# Patient Record
Sex: Female | Born: 1990 | Race: Black or African American | Hispanic: No | Marital: Married | Smoking: Never smoker
Health system: Southern US, Community
[De-identification: ages and names within clinical notes are randomized; demographics above are authoritative.]

---

## 2020-12-11 ENCOUNTER — Emergency Department (HOSPITAL_COMMUNITY): Payer: Medicaid Other

## 2020-12-11 ENCOUNTER — Encounter (HOSPITAL_COMMUNITY)
Admission: EM | Disposition: A | Discharge status: Home / Self Care | Payer: Self-pay | Source: Home / Self Care | Attending: Emergency Medicine

## 2020-12-11 ENCOUNTER — Emergency Department (HOSPITAL_COMMUNITY)
Admission: EM | Admit: 2020-12-11 | Discharge: 2020-12-11 | Disposition: A | Discharge status: Home / Self Care | Payer: Medicaid Other | Attending: Emergency Medicine | Admitting: Emergency Medicine

## 2020-12-11 ENCOUNTER — Encounter (HOSPITAL_COMMUNITY): Payer: Self-pay | Admitting: Certified Registered Nurse Anesthetist

## 2020-12-11 ENCOUNTER — Other Ambulatory Visit: Payer: Self-pay

## 2020-12-11 ENCOUNTER — Encounter (HOSPITAL_COMMUNITY): Payer: Self-pay | Admitting: Emergency Medicine

## 2020-12-11 DIAGNOSIS — Z20822 Contact with and (suspected) exposure to covid-19: Secondary | ICD-10-CM | POA: Insufficient documentation

## 2020-12-11 DIAGNOSIS — Y92410 Unspecified street and highway as the place of occurrence of the external cause: Secondary | ICD-10-CM | POA: Insufficient documentation

## 2020-12-11 DIAGNOSIS — M25572 Pain in left ankle and joints of left foot: Secondary | ICD-10-CM | POA: Diagnosis not present

## 2020-12-11 DIAGNOSIS — S82402A Unspecified fracture of shaft of left fibula, initial encounter for closed fracture: Secondary | ICD-10-CM | POA: Insufficient documentation

## 2020-12-11 DIAGNOSIS — O9A211 Injury, poisoning and certain other consequences of external causes complicating pregnancy, first trimester: Secondary | ICD-10-CM | POA: Diagnosis not present

## 2020-12-11 DIAGNOSIS — N9489 Other specified conditions associated with female genital organs and menstrual cycle: Secondary | ICD-10-CM | POA: Insufficient documentation

## 2020-12-11 DIAGNOSIS — S82302A Unspecified fracture of lower end of left tibia, initial encounter for closed fracture: Secondary | ICD-10-CM | POA: Diagnosis not present

## 2020-12-11 DIAGNOSIS — S82202A Unspecified fracture of shaft of left tibia, initial encounter for closed fracture: Secondary | ICD-10-CM

## 2020-12-11 DIAGNOSIS — Z3490 Encounter for supervision of normal pregnancy, unspecified, unspecified trimester: Secondary | ICD-10-CM

## 2020-12-11 LAB — I-STAT BETA HCG BLOOD, ED (MC, WL, AP ONLY): I-stat hCG, quantitative: >2000 m[IU]/mL — ABNORMAL HIGH (ref <5)

## 2020-12-11 LAB — TYPE AND SCREEN
ABO/RH(D): O NEG
Antibody Screen: NEGATIVE

## 2020-12-11 LAB — RESP PANEL BY RT-PCR (FLU A&B, COVID) ARPGX2
Influenza A by PCR: NEGATIVE
Influenza B by PCR: NEGATIVE
SARS Coronavirus 2 by RT PCR: NEGATIVE

## 2020-12-11 LAB — HCG, QUANTITATIVE, PREGNANCY: hCG, Beta Chain, Quant, S: 59179 m[IU]/mL — ABNORMAL HIGH (ref <5)

## 2020-12-11 SURGERY — INSERTION, INTRAMEDULLARY ROD, TIBIA
Anesthesia: General | Laterality: Left

## 2020-12-11 NOTE — Discharge Instructions (Addendum)
Please keep outpatient appointment for repair of your leg. Keep leg elevated as much as possible.  Use acetaminophen over-the-counter for pain.  You can also use cold therapy but did not apply ice directly to leg.  Return if you are having worsening and or severe pain. Call Dr. Luvenia Starch office first thing tomorrow to be seen Monday prior to surgery.
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
## 2020-12-11 NOTE — Progress Notes (Signed)
Orthopedic Tech Progress Note Patient Details:  Pamela Santos 1991/01/08 [id number]  Ortho Devices Type of Ortho Device: Stirrup splint, Short leg splint Ortho Device/Splint Location: LLE Ortho Device/Splint Interventions: Ordered, Application   Post Interventions Patient Tolerated: Well Instructions Provided: Care of device, Poper ambulation with device  Pamela Santos A Pamela Santos 12/11/2020, 4:10 PM
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
## 2020-12-11 NOTE — Progress Notes (Signed)
RROB asked to doppler fht to prevent pt from having to go to MAU.  FHT dopplered at 151bpm.  E.Lawrence, NP notified and pt is OB cleared.

## 2020-12-12 ENCOUNTER — Telehealth: Payer: Self-pay | Admitting: Radiology

## 2020-12-12 NOTE — Telephone Encounter (Signed)
Left a message for patient via interpreter services for New OB appointment with Dr Donavan Foil on 01/06/21 @ 2:55 at [facility].

## 2020-12-15 ENCOUNTER — Encounter (HOSPITAL_COMMUNITY): Payer: Self-pay | Admitting: Student

## 2020-12-15 ENCOUNTER — Other Ambulatory Visit: Payer: Self-pay

## 2020-12-15 ENCOUNTER — Ambulatory Visit: Payer: Self-pay | Admitting: Student

## 2020-12-15 DIAGNOSIS — S82202A Unspecified fracture of shaft of left tibia, initial encounter for closed fracture: Secondary | ICD-10-CM

## 2020-12-15 DIAGNOSIS — S82402A Unspecified fracture of shaft of left fibula, initial encounter for closed fracture: Secondary | ICD-10-CM

## 2020-12-15 HISTORY — DX: Unspecified fracture of shaft of left tibia, initial encounter for closed fracture: S82.202A

## 2020-12-15 HISTORY — DX: Unspecified fracture of shaft of left tibia, initial encounter for closed fracture: S82.402A
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
## 2020-12-16 ENCOUNTER — Encounter (HOSPITAL_COMMUNITY): Payer: Self-pay | Admitting: Student

## 2020-12-16 ENCOUNTER — Ambulatory Visit (HOSPITAL_COMMUNITY): Payer: Medicaid Other | Admitting: Certified Registered Nurse Anesthetist

## 2020-12-16 ENCOUNTER — Ambulatory Visit (HOSPITAL_COMMUNITY): Payer: Medicaid Other

## 2020-12-16 ENCOUNTER — Encounter (HOSPITAL_COMMUNITY)
Admission: RE | Disposition: A | Discharge status: Home / Self Care | Payer: Self-pay | Source: Home / Self Care | Attending: Student

## 2020-12-16 ENCOUNTER — Ambulatory Visit (HOSPITAL_COMMUNITY)
Admission: RE | Admit: 2020-12-16 | Discharge: 2020-12-16 | Disposition: A | Discharge status: Home / Self Care | Payer: Medicaid Other | Attending: Student | Admitting: Student

## 2020-12-16 ENCOUNTER — Other Ambulatory Visit (HOSPITAL_COMMUNITY): Payer: Self-pay

## 2020-12-16 DIAGNOSIS — S82402A Unspecified fracture of shaft of left fibula, initial encounter for closed fracture: Secondary | ICD-10-CM

## 2020-12-16 DIAGNOSIS — S82202A Unspecified fracture of shaft of left tibia, initial encounter for closed fracture: Secondary | ICD-10-CM | POA: Diagnosis not present

## 2020-12-16 DIAGNOSIS — T148XXA Other injury of unspecified body region, initial encounter: Secondary | ICD-10-CM

## 2020-12-16 DIAGNOSIS — Z419 Encounter for procedure for purposes other than remedying health state, unspecified: Secondary | ICD-10-CM

## 2020-12-16 DIAGNOSIS — O9A212 Injury, poisoning and certain other consequences of external causes complicating pregnancy, second trimester: Secondary | ICD-10-CM | POA: Insufficient documentation

## 2020-12-16 DIAGNOSIS — Z3A14 14 weeks gestation of pregnancy: Secondary | ICD-10-CM | POA: Insufficient documentation

## 2020-12-16 DIAGNOSIS — O99212 Obesity complicating pregnancy, second trimester: Secondary | ICD-10-CM | POA: Diagnosis not present

## 2020-12-16 DIAGNOSIS — G8918 Other acute postprocedural pain: Secondary | ICD-10-CM | POA: Diagnosis not present

## 2020-12-16 HISTORY — PX: TIBIA IM NAIL INSERTION: SHX2516

## 2020-12-16 LAB — CBC
HCT: 37.9 % (ref 36.0–46.0)
Hemoglobin: 12.5 g/dL (ref 12.0–15.0)
MCH: 28.5 pg (ref 26.0–34.0)
MCHC: 33 g/dL (ref 30.0–36.0)
MCV: 86.5 fL (ref 80.0–100.0)
Platelets: 268 10*3/uL (ref 150–400)
RBC: 4.38 MIL/uL (ref 3.87–5.11)
RDW: 12.8 % (ref 11.5–15.5)
WBC: 5.9 10*3/uL (ref 4.0–10.5)
nRBC: 0 % (ref 0.0–0.2)

## 2020-12-16 SURGERY — INSERTION, INTRAMEDULLARY ROD, TIBIA
Anesthesia: General | Laterality: Left

## 2020-12-16 MED ORDER — ROCURONIUM BROMIDE 10 MG/ML (PF) SYRINGE
PREFILLED_SYRINGE | INTRAVENOUS | Status: AC
  Filled 2020-12-16: qty 10

## 2020-12-16 MED ORDER — DEXAMETHASONE SODIUM PHOSPHATE 10 MG/ML IJ SOLN
INTRAMUSCULAR | Status: DC | PRN
  Administered 2020-12-16: 5 mg via INTRAVENOUS

## 2020-12-16 MED ORDER — SUCCINYLCHOLINE CHLORIDE 200 MG/10ML IV SOSY
PREFILLED_SYRINGE | INTRAVENOUS | Status: AC
  Filled 2020-12-16: qty 10

## 2020-12-16 MED ORDER — ORAL CARE MOUTH RINSE: 15.0000 mL | Freq: Once | OROMUCOSAL | Status: AC

## 2020-12-16 MED ORDER — OXYCODONE HCL 5 MG PO TABS
ORAL_TABLET | ORAL | Status: AC
  Administered 2020-12-16: 5 mg via ORAL
  Filled 2020-12-16: qty 1

## 2020-12-16 MED ORDER — ONDANSETRON HCL 4 MG/2ML IJ SOLN
INTRAMUSCULAR | Status: DC | PRN
  Administered 2020-12-16: 4 mg via INTRAVENOUS

## 2020-12-16 MED ORDER — CHLORHEXIDINE GLUCONATE 0.12 % MT SOLN
15.0000 mL | Freq: Once | OROMUCOSAL | Status: AC
  Administered 2020-12-16: 15 mL via OROMUCOSAL
  Filled 2020-12-16: qty 15

## 2020-12-16 MED ORDER — ONDANSETRON HCL 4 MG/2ML IJ SOLN
INTRAMUSCULAR | Status: AC
  Filled 2020-12-16: qty 2

## 2020-12-16 MED ORDER — CEFAZOLIN SODIUM-DEXTROSE 2-4 GM/100ML-% IV SOLN
INTRAVENOUS | Status: AC
  Filled 2020-12-16: qty 100

## 2020-12-16 MED ORDER — MIDAZOLAM HCL 2 MG/2ML IJ SOLN
INTRAMUSCULAR | Status: DC | PRN
  Administered 2020-12-16 (×2): 1 mg via INTRAVENOUS

## 2020-12-16 MED ORDER — BUPIVACAINE-EPINEPHRINE (PF) 0.5% -1:200000 IJ SOLN
INTRAMUSCULAR | Status: DC | PRN
  Administered 2020-12-16: 10 mL via PERINEURAL
  Administered 2020-12-16: 20 mL via PERINEURAL

## 2020-12-16 MED ORDER — 0.9 % SODIUM CHLORIDE (POUR BTL) OPTIME
TOPICAL | Status: DC | PRN
  Administered 2020-12-16: 1000 mL

## 2020-12-16 MED ORDER — SUGAMMADEX SODIUM 200 MG/2ML IV SOLN
INTRAVENOUS | Status: DC | PRN
  Administered 2020-12-16: 200 mg via INTRAVENOUS

## 2020-12-16 MED ORDER — DEXAMETHASONE SODIUM PHOSPHATE 10 MG/ML IJ SOLN
INTRAMUSCULAR | Status: AC
  Filled 2020-12-16: qty 1

## 2020-12-16 MED ORDER — LIDOCAINE 2% (20 MG/ML) 5 ML SYRINGE
INTRAMUSCULAR | Status: DC | PRN
  Administered 2020-12-16: 60 mg via INTRAVENOUS

## 2020-12-16 MED ORDER — CEFAZOLIN SODIUM-DEXTROSE 2-3 GM-%(50ML) IV SOLR
INTRAVENOUS | Status: DC | PRN
  Administered 2020-12-16: 2 g via INTRAVENOUS

## 2020-12-16 MED ORDER — VANCOMYCIN HCL 1000 MG IV SOLR
INTRAVENOUS | Status: AC
  Filled 2020-12-16: qty 20

## 2020-12-16 MED ORDER — FENTANYL CITRATE (PF) 250 MCG/5ML IJ SOLN
INTRAMUSCULAR | Status: DC | PRN
  Administered 2020-12-16 (×3): 50 ug via INTRAVENOUS
  Administered 2020-12-16: 25 ug via INTRAVENOUS

## 2020-12-16 MED ORDER — OXYCODONE HCL 5 MG/5ML PO SOLN: 5.0000 mg | Freq: Once | ORAL | Status: AC | PRN

## 2020-12-16 MED ORDER — LACTATED RINGERS IV BOLUS
1000.0000 mL | Freq: Once | INTRAVENOUS | Status: AC
  Administered 2020-12-16: 1000 mL via INTRAVENOUS

## 2020-12-16 MED ORDER — PROPOFOL 500 MG/50ML IV EMUL
INTRAVENOUS | Status: DC | PRN
  Administered 2020-12-16: 150 ug/kg/min via INTRAVENOUS

## 2020-12-16 MED ORDER — FENTANYL CITRATE (PF) 100 MCG/2ML IJ SOLN: 25.0000 ug | INTRAMUSCULAR | Status: DC | PRN

## 2020-12-16 MED ORDER — LIDOCAINE 2% (20 MG/ML) 5 ML SYRINGE
INTRAMUSCULAR | Status: AC
  Filled 2020-12-16: qty 5

## 2020-12-16 MED ORDER — OXYCODONE HCL 5 MG PO TABS: 5.0000 mg | ORAL_TABLET | Freq: Once | ORAL | Status: AC | PRN

## 2020-12-16 MED ORDER — SUCCINYLCHOLINE CHLORIDE 200 MG/10ML IV SOSY
PREFILLED_SYRINGE | INTRAVENOUS | Status: DC | PRN
  Administered 2020-12-16: 80 mg via INTRAVENOUS

## 2020-12-16 MED ORDER — FENTANYL CITRATE (PF) 250 MCG/5ML IJ SOLN
INTRAMUSCULAR | Status: AC
  Filled 2020-12-16: qty 5

## 2020-12-16 MED ORDER — VANCOMYCIN HCL 1000 MG IV SOLR
INTRAVENOUS | Status: DC | PRN
  Administered 2020-12-16: 1000 mg

## 2020-12-16 MED ORDER — PROMETHAZINE HCL 25 MG/ML IJ SOLN
INTRAMUSCULAR | Status: AC
  Administered 2020-12-16: 6.25 mg via INTRAVENOUS
  Filled 2020-12-16: qty 1

## 2020-12-16 MED ORDER — ACETAMINOPHEN 500 MG PO TABS
1000.0000 mg | ORAL_TABLET | Freq: Once | ORAL | Status: AC
  Administered 2020-12-16: 1000 mg via ORAL
  Filled 2020-12-16: qty 2

## 2020-12-16 MED ORDER — PROPOFOL 10 MG/ML IV BOLUS
INTRAVENOUS | Status: DC | PRN
  Administered 2020-12-16: 120 mg via INTRAVENOUS
  Administered 2020-12-16: 30 mg via INTRAVENOUS

## 2020-12-16 MED ORDER — PROPOFOL 10 MG/ML IV BOLUS
INTRAVENOUS | Status: AC
  Filled 2020-12-16: qty 40

## 2020-12-16 MED ORDER — PROMETHAZINE HCL 25 MG/ML IJ SOLN: 6.2500 mg | INTRAMUSCULAR | Status: DC | PRN

## 2020-12-16 MED ORDER — ROCURONIUM BROMIDE 10 MG/ML (PF) SYRINGE
PREFILLED_SYRINGE | INTRAVENOUS | Status: DC | PRN
  Administered 2020-12-16: 10 mg via INTRAVENOUS
  Administered 2020-12-16: 20 mg via INTRAVENOUS
  Administered 2020-12-16: 30 mg via INTRAVENOUS

## 2020-12-16 MED ORDER — HYDROCODONE-ACETAMINOPHEN 5-325 MG PO TABS
1.0000 | ORAL_TABLET | Freq: Four times a day (QID) | ORAL | 0 refills | Status: DC | PRN
Start: 2020-12-16 — End: 2021-06-12
  Filled 2020-12-16: qty 30, 4d supply, fill #0

## 2020-12-16 MED ORDER — MIDAZOLAM HCL 2 MG/2ML IJ SOLN
INTRAMUSCULAR | Status: AC
  Filled 2020-12-16: qty 2

## 2020-12-16 MED ORDER — PHENYLEPHRINE HCL-NACL 20-0.9 MG/250ML-% IV SOLN
INTRAVENOUS | Status: DC | PRN
  Administered 2020-12-16: 40 ug/min via INTRAVENOUS

## 2020-12-16 MED ORDER — CLONIDINE HCL (ANALGESIA) 100 MCG/ML EP SOLN
EPIDURAL | Status: DC | PRN
  Administered 2020-12-16: 67 ug
  Administered 2020-12-16: 33 ug

## 2020-12-16 MED ORDER — SCOPOLAMINE 1 MG/3DAYS TD PT72
1.0000 | MEDICATED_PATCH | Freq: Once | TRANSDERMAL | Status: DC
  Administered 2020-12-16: 1.5 mg via TRANSDERMAL
  Filled 2020-12-16: qty 1

## 2020-12-16 MED ORDER — LACTATED RINGERS IV SOLN: INTRAVENOUS | Status: DC

## 2020-12-16 MED ORDER — ASPIRIN 81 MG PO TBEC
81.0000 mg | DELAYED_RELEASE_TABLET | Freq: Every day | ORAL | 0 refills | Status: AC
  Filled 2020-12-16: qty 30, 30d supply, fill #0

## 2020-12-16 SURGICAL SUPPLY — 54 items
BAG COUNTER SPONGE SURGICOUNT (BAG) ×2 IMPLANT
BIT DRILL CALIBRATED 4.2 (BIT) ×1 IMPLANT
BIT DRILL FLUTED 3.2X145 SHORT (BIT) ×2 IMPLANT
BLADE SURG 10 STRL SS (BLADE) ×4 IMPLANT
BNDG COHESIVE 4X5 TAN STRL (GAUZE/BANDAGES/DRESSINGS) ×2 IMPLANT
BNDG ELASTIC 4X5.8 VLCR STR LF (GAUZE/BANDAGES/DRESSINGS) ×2 IMPLANT
BNDG ELASTIC 6X5.8 VLCR STR LF (GAUZE/BANDAGES/DRESSINGS) ×4 IMPLANT
BNDG GAUZE ELAST 4 BULKY (GAUZE/BANDAGES/DRESSINGS) IMPLANT
BRUSH SCRUB EZ PLAIN DRY (MISCELLANEOUS) ×4 IMPLANT
CHLORAPREP W/TINT 26 (MISCELLANEOUS) ×4 IMPLANT
COVER SURGICAL LIGHT HANDLE (MISCELLANEOUS) ×4 IMPLANT
DRAPE C-ARM 42X72 X-RAY (DRAPES) ×2 IMPLANT
DRAPE C-ARMOR (DRAPES) ×2 IMPLANT
DRAPE HALF SHEET 40X57 (DRAPES) ×4 IMPLANT
DRAPE IMP U-DRAPE 54X76 (DRAPES) ×4 IMPLANT
DRAPE INCISE IOBAN 66X45 STRL (DRAPES) IMPLANT
DRAPE ORTHO SPLIT 77X108 STRL (DRAPES) ×2
DRAPE SURG ORHT 6 SPLT 77X108 (DRAPES) ×2 IMPLANT
DRAPE U-SHAPE 47X51 STRL (DRAPES) ×2 IMPLANT
DRILL BIT CALIBRATED 4.2 (BIT) ×2
DRSG ADAPTIC 3X8 NADH LF (GAUZE/BANDAGES/DRESSINGS) IMPLANT
ELECT REM PT RETURN 9FT ADLT (ELECTROSURGICAL) ×2
ELECTRODE REM PT RTRN 9FT ADLT (ELECTROSURGICAL) ×1 IMPLANT
GAUZE SPONGE 4X4 12PLY STRL (GAUZE/BANDAGES/DRESSINGS) ×4 IMPLANT
GLOVE SURG ENC MOIS LTX SZ6.5 (GLOVE) ×6 IMPLANT
GLOVE SURG ENC MOIS LTX SZ7.5 (GLOVE) ×8 IMPLANT
GLOVE SURG UNDER POLY LF SZ6.5 (GLOVE) ×2 IMPLANT
GLOVE SURG UNDER POLY LF SZ7.5 (GLOVE) ×2 IMPLANT
GOWN STRL REUS W/ TWL LRG LVL3 (GOWN DISPOSABLE) ×2 IMPLANT
GOWN STRL REUS W/TWL LRG LVL3 (GOWN DISPOSABLE) ×2
GUIDEWIRE 3.2X400 (WIRE) ×2 IMPLANT
KIT BASIN OR (CUSTOM PROCEDURE TRAY) ×2 IMPLANT
KIT TURNOVER KIT B (KITS) ×2 IMPLANT
NAIL TIB TFNA 8X330 (Nail) ×2 IMPLANT
PACK TOTAL JOINT (CUSTOM PROCEDURE TRAY) ×2 IMPLANT
PAD ARMBOARD 7.5X6 YLW CONV (MISCELLANEOUS) ×4 IMPLANT
PAD CAST 4YDX4 CTTN HI CHSV (CAST SUPPLIES) ×1 IMPLANT
PADDING CAST COTTON 4X4 STRL (CAST SUPPLIES) ×1
PADDING CAST COTTON 6X4 STRL (CAST SUPPLIES) ×2 IMPLANT
REAMER ROD 3.8 BALL TIP 3X950 (ORTHOPEDIC DISPOSABLE SUPPLIES) ×2 IMPLANT
SCREW LOCK HDLS 5X34 (Screw) ×2 IMPLANT
SCREW LOCK HDLS 5X60 (Screw) ×2 IMPLANT
SCREW LOCK LP HDLS 4X32 (Screw) ×4 IMPLANT
STAPLER VISISTAT 35W (STAPLE) ×2 IMPLANT
STRIP CLOSURE SKIN 1/2X4 (GAUZE/BANDAGES/DRESSINGS) ×2 IMPLANT
SUT MNCRL AB 3-0 PS2 18 (SUTURE) IMPLANT
SUT MNCRL AB 3-0 PS2 27 (SUTURE) ×2 IMPLANT
SUT VIC AB 0 CT1 27 (SUTURE) ×2
SUT VIC AB 0 CT1 27XBRD ANBCTR (SUTURE) ×2 IMPLANT
SUT VIC AB 2-0 CT1 27 (SUTURE) ×2
SUT VIC AB 2-0 CT1 TAPERPNT 27 (SUTURE) ×2 IMPLANT
TOWEL GREEN STERILE (TOWEL DISPOSABLE) ×4 IMPLANT
TOWEL GREEN STERILE FF (TOWEL DISPOSABLE) ×2 IMPLANT
YANKAUER SUCT BULB TIP NO VENT (SUCTIONS) IMPLANT

## 2020-12-16 NOTE — Op Note (Signed)
Orthopaedic Surgery Operative Note (CSN: [id number] ) Date of Surgery: 12/16/2020  Admit Date: 12/16/2020   Diagnoses: Pre-Op Diagnoses: Left tibia nonunion/impending malunion  Post-Op Diagnosis: Same  Procedures: CPT 27759-Intramedullary nailing of left tibia fracture  Surgeons : Primary: Roby Lofts, MD  Assistant: Ulyses Southward, PA-C  Location: OR 7   Anesthesia:General with regional anesthesia   Antibiotics: Ancef 2g preop with 1 gm vancomycin placed topically   Tourniquet time: None used    Estimated Blood Loss:75 mL  Complications:None   Specimens:None  Implants: Implant Name Type Inv. Item Serial No. Manufacturer Lot No. LRB No. Used Action  NAIL TIB TFNA [id number] - [id number] Nail NAIL TIB TFNA [id number]  [employer] [id number] Left 1 Implanted  LOW PROF LCKNG SCREW F/IM NAIL4.0MM / L / XL25/ STER     [id number] Left 2 Implanted  LOW PROF LCKNG SCREW F/IM NAIL 5.0MM / L / XL25/ STER     [id number] Left 1 Implanted  SCREW LOCK HDLS 5X34 - [id number] Screw SCREW LOCK HDLS 5X34  [employer] [id number] Left 1 Implanted     Indications for Surgery: 30 year old female who injured her leg in [place] she was initially casted but alignment was not maintained and subsequently she travel to [place].  This is approximately 5 weeks ago.  Due to the continued unstable nature of her injury, the continued pain, the deformity and the loss of function that a malunion would because I recommend proceeding with intramedullary nailing of left tibia.  Risks and benefits were discussed with the patient.  Risks included but not limited to bleeding, infection, malunion, nonunion, hardware failure, hardware irritation, nerve or blood vessel injury, DVT, even possibility anesthetic complications.  She agreed to proceed with surgery and consent was obtained.  Operative Findings: Takedown of previous partial united tibia fracture with intramedullary nailing using Synthes TNA 8 x 330  mm nail  Procedure: The patient was identified in the preoperative holding area. Consent was confirmed with the patient and their family and all questions were answered. The operative extremity was marked after confirmation with the patient. she was then brought back to the operating room by our anesthesia colleagues.  She was placed under general anesthetic and carefully transferred over to a radiolucent flat top table.  Her abdomen was covered with lead to prevent radiation exposure to her unborn fetus.  The left lower extremity was then prepped and draped in usual sterile fashion.  A timeout was performed to verify the patient, the procedure, and the extremity.  Preoperative antibiotics were dosed.  Fluoroscopic imaging was obtained that showed the unstable and shortened nature of her fracture.  An attempt was made to reduce the fracture closed but this was not allowing any reduction.  As result I made a direct anterior approach to the tibia carried down through skin and subcutaneous tissue.  I then incised through the callus that was forming and performed a subperiosteal dissection to expose the fracture.  I cleaned out the hematoma and the cleared the soft tissue and provided traction to the lower extremity.  I then clamped the fracture in place to reduce the shaft of the tibia.  I did not expose or disrupt any healing around the fibula.  Once I had adequate reduction I then made a lateral parapatellar incision at the knee.  I carried it down through skin and subcutaneous tissue.  I incised through the retinaculum to mobilize the patella medially.  I then directed a threaded guidewire at  the proximal tibia and advanced into the metaphysis.  I then used an entry reamer to enter the medullary canal.  I then passed the ball-tipped guidewire down the center of the canal and seated into the distal metaphysis.  I then measured the length and chose to use a 330 mm nail.  I then sequentially reamed from 30mm to  9.5 mm I obtained good chatter and decided to place an 8 mm nail.  The nail was then passed.  I then used a perfect circle technique to place 2 distal medial to lateral interlocking screws.  I then used the targeting arm to place 2 proximal interlocking screws.  Final fluoroscopic imaging was obtained.  The incisions were copiously irrigated.  A gram of vancomycin powder was placed into the incision.  A layer closure of 0 Vicryl, 2-0 Vicryl and 3-0 Monocryl was used to close the skin.  Sterile dressings were placed.  Patient was then awoke from anesthesia and taken to the PACU in stable condition.  Post Op Plan/Instructions: Patient will be nonweightbearing to the left lower extremity.  She will receive aspirin for DVT prophylaxis.  She will be discharged home postop.  She will follow-up in 2 weeks for x-rays and wound check.  I was present and performed the entire surgery.  Ulyses Southward, PA-C did assist me throughout the case. An assistant was necessary given the difficulty in approach, maintenance of reduction and ability to instrument the fracture.   Truitt Merle, MD [facility]

## 2020-12-16 NOTE — Transfer of Care (Signed)
Immediate Anesthesia Transfer of Care Note  Patient: Silvano Rusk  Procedure(s) Performed: INTRAMEDULLARY (IM) NAIL TIBIAL (Left)  Patient Location: PACU  Anesthesia Type:General  Level of Consciousness: awake and alert   Airway & Oxygen Therapy: Patient Spontanous Breathing and Patient connected to face mask oxygen  Post-op Assessment: Report given to RN and Post -op Vital signs reviewed and stable  Post vital signs: Reviewed and stable  Last Vitals:  Vitals Value Taken Time  BP 86/61 12/16/20 1051  Temp    Pulse 100 12/16/20 1056  Resp 17 12/16/20 1056  SpO2 98 % 12/16/20 1056  Vitals shown include unvalidated device data.  Last Pain:  Vitals:   12/16/20 0713  TempSrc:   PainSc: 5          Complications: No notable events documented.

## 2020-12-16 NOTE — Anesthesia Procedure Notes (Signed)
Anesthesia Regional Block: Adductor canal block   Pre-Anesthetic Checklist: , timeout performed,  Correct Patient, Correct Site, Correct Laterality,  Correct Procedure, Correct Position, site marked,  Risks and benefits discussed,  Pre-op evaluation,  At surgeon's request and post-op pain management  Laterality: Left  Prep: Maximum Sterile Barrier Precautions used, chloraprep       Needles:  Injection technique: Single-shot  Needle Type: Echogenic Stimulator Needle     Needle Length: 9cm  Needle Gauge: 22     Additional Needles:   Procedures:,,,, ultrasound used (permanent image in chart),,    Narrative:  Start time: 12/16/2020 8:18 AM End time: 12/16/2020 8:21 AM Injection made incrementally with aspirations every 5 mL.  Performed by: Personally  Anesthesiologist: Kaylyn Layer, MD  Additional Notes: Risks, benefits, and alternative discussed. Patient gave consent for procedure. Patient prepped and draped in sterile fashion. Sedation administered, patient remains easily responsive to voice. Relevant anatomy identified with ultrasound guidance. Local anesthetic given in 5cc increments with no signs or symptoms of intravascular injection. No pain or paraesthesias with injection. Patient monitored throughout procedure with signs of LAST or immediate complications. Tolerated well. Ultrasound image placed in chart.  Amalia Greenhouse, MD

## 2020-12-16 NOTE — Progress Notes (Signed)
Orthopedic Tech Progress Note Patient Details:  Pamela Santos 1990/02/16 [id number]  Ortho Devices Type of Ortho Device: CAM walker Ortho Device/Splint Location: Left leg Ortho Device/Splint Interventions: Application   Post Interventions Patient Tolerated: Well  Pamela Santos 12/16/2020, 12:03 PM

## 2020-12-16 NOTE — Anesthesia Procedure Notes (Signed)
Procedure Name: Intubation Date/Time: 12/16/2020 8:50 AM Performed by: Reece Agar, CRNA Pre-anesthesia Checklist: Patient identified, Emergency Drugs available, Suction available and Patient being monitored Patient Re-evaluated:Patient Re-evaluated prior to induction Oxygen Delivery Method: Circle System Utilized Preoxygenation: Pre-oxygenation with 100% oxygen Induction Type: IV induction, Rapid sequence and Cricoid Pressure applied Ventilation: Unable to mask ventilate Laryngoscope Size: Mac and 3 Grade View: Grade I Tube type: Oral Tube size: 7.0 mm Number of attempts: 1 Airway Equipment and Method: Stylet Placement Confirmation: ETT inserted through vocal cords under direct vision, positive ETCO2 and breath sounds checked- equal and bilateral Secured at: 21 cm Tube secured with: Tape Dental Injury: Teeth and Oropharynx as per pre-operative assessment  Comments: Pt having n/v with pregnancy, RSI performed.

## 2020-12-16 NOTE — Anesthesia Preprocedure Evaluation (Addendum)
Anesthesia Evaluation  Patient identified by MRN, date of birth, ID band Patient awake    Reviewed: Allergy & Precautions, NPO status , Patient's Chart, lab work & pertinent test results  History of Anesthesia Complications Negative for: history of anesthetic complications  Airway Mallampati: II  TM Distance: >3 FB Neck ROM: Full    Dental no notable dental hx.    Pulmonary neg pulmonary ROS,    Pulmonary exam normal        Cardiovascular negative cardio ROS Normal cardiovascular exam     Neuro/Psych negative neurological ROS  negative psych ROS   GI/Hepatic negative GI ROS, Neg liver ROS,   Endo/Other  negative endocrine ROS  Renal/GU negative Renal ROS  negative genitourinary   Musculoskeletal Left tib/fib fracture   Abdominal   Peds  Hematology negative hematology ROS (+)   Anesthesia Other Findings Day of surgery medications reviewed with patient.  Reproductive/Obstetrics (+) Pregnancy ([redacted]w[redacted]d gestation)                            Anesthesia Physical Anesthesia Plan  ASA: 2  Anesthesia Plan: General   Post-op Pain Management: GA combined w/ Regional for post-op pain   Induction: Intravenous  PONV Risk Score and Plan: 3 and Treatment may vary due to age or medical condition, Ondansetron, Dexamethasone, Scopolamine patch - Pre-op, TIVA and Propofol infusion  Airway Management Planned: Oral ETT  Additional Equipment: None  Intra-op Plan:   Post-operative Plan: Extubation in OR  Informed Consent: I have reviewed the patients History and Physical, chart, labs and discussed the procedure including the risks, benefits and alternatives for the proposed anesthesia with the patient or authorized representative who has indicated his/her understanding and acceptance.     Dental advisory given and Interpreter used for interveiw  Plan Discussed with: CRNA  Anesthesia Plan  Comments: (Consent reviewed with assistance of [place] interpreter in preop. Stephannie Peters, MD)      Anesthesia Quick Evaluation

## 2020-12-16 NOTE — Anesthesia Procedure Notes (Signed)
Anesthesia Regional Block: Popliteal block   Pre-Anesthetic Checklist: , timeout performed,  Correct Patient, Correct Site, Correct Laterality,  Correct Procedure, Correct Position, site marked,  Risks and benefits discussed,  Pre-op evaluation,  At surgeon's request and post-op pain management  Laterality: Left  Prep: Maximum Sterile Barrier Precautions used, chloraprep       Needles:  Injection technique: Single-shot  Needle Type: Echogenic Stimulator Needle     Needle Length: 9cm  Needle Gauge: 22     Additional Needles:   Procedures:,,,, ultrasound used (permanent image in chart),,    Narrative:  Start time: 12/16/2020 8:21 AM End time: 12/16/2020 8:23 AM Injection made incrementally with aspirations every 5 mL.  Performed by: Personally  Anesthesiologist: Kaylyn Layer, MD  Additional Notes: Risks, benefits, and alternative discussed. Patient gave consent for procedure. Patient prepped and draped in sterile fashion. Sedation administered, patient remains easily responsive to voice. Relevant anatomy identified with ultrasound guidance. Local anesthetic given in 5cc increments with no signs or symptoms of intravascular injection. No pain or paraesthesias with injection. Patient monitored throughout procedure with signs of LAST or immediate complications. Tolerated well. Ultrasound image placed in chart.  Amalia Greenhouse, MD
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
## 2020-12-16 NOTE — Discharge Instructions (Addendum)
[facility] Discharge Instructions   General Discharge Instructions  WEIGHT BEARING STATUS: Non-weightbearing left leg  RANGE OF MOTION/ACTIVITY: Ok to come out of your black boot ankle range of motion as tolerated. No restrictions on knee motion  Wound Care:You may remove your surgical dressing on post-op day #2 (Friday 12/18/20). Leave the white steri-strips in place over your incisions. Incisions can be left open to air if there is no drainage. If incision continues to have drainage, follow wound care instructions below. Okay to shower if no drainage from incisions.  DVT/PE prophylaxis: Aspirin 81 mg daily x 30 days  Diet: as you were eating previously.  Can use over the counter stool softeners and bowel preparations, such as Miralax, to help with bowel movements.  Narcotics can be constipating.  Be sure to drink plenty of fluids  PAIN MEDICATION USE AND EXPECTATIONS  You have likely been given narcotic medications to help control your pain.  After a traumatic event that results in an fracture (broken bone) with or without surgery, it is ok to use narcotic pain medications to help control one's pain.  We understand that everyone responds to pain differently and each individual patient will be evaluated on a regular basis for the continued need for narcotic medications. Ideally, narcotic medication use should last no more than 6-8 weeks (coinciding with fracture healing).   As a patient it is your responsibility as well to monitor narcotic medication use and report the amount and frequency you use these medications when you come to your office visit.   We would also advise that if you are using narcotic medications, you should take a dose prior to therapy to maximize you participation.  IF YOU ARE ON NARCOTIC MEDICATIONS IT IS NOT PERMISSIBLE TO OPERATE A MOTOR VEHICLE (MOTORCYCLE/CAR/TRUCK/MOPED) OR HEAVY MACHINERY DO NOT MIX NARCOTICS WITH OTHER CNS (CENTRAL NERVOUS  SYSTEM) DEPRESSANTS SUCH AS ALCOHOL   STOP SMOKING OR USING NICOTINE PRODUCTS!!!!  As discussed nicotine severely impairs your body's ability to heal surgical and traumatic wounds but also impairs bone healing.  Wounds and bone heal by forming microscopic blood vessels (angiogenesis) and nicotine is a vasoconstrictor (essentially, shrinks blood vessels).  Therefore, if vasoconstriction occurs to these microscopic blood vessels they essentially disappear and are unable to deliver necessary nutrients to the healing tissue.  This is one modifiable factor that you can do to dramatically increase your chances of healing your injury.    (This means no smoking, no nicotine gum, patches, etc)  DO NOT USE NONSTEROIDAL ANTI-INFLAMMATORY DRUGS (NSAID'S)  Using products such as Advil (ibuprofen), Aleve (naproxen), Motrin (ibuprofen) for additional pain control during fracture healing can delay and/or prevent the healing response.  If you would like to take over the counter (OTC) medication, Tylenol (acetaminophen) is ok.  However, some narcotic medications that are given for pain control contain acetaminophen as well. Therefore, you should not exceed more than 4000 mg of tylenol in a day if you do not have liver disease.  Also note that there are may OTC medicines, such as cold medicines and allergy medicines that my contain tylenol as well.  If you have any questions about medications and/or interactions please ask your doctor/PA or your pharmacist.      ICE AND ELEVATE INJURED/OPERATIVE EXTREMITY  Using ice and elevating the injured extremity above your heart can help with swelling and pain control.  Icing in a pulsatile fashion, such as 20 minutes on and 20 minutes off, can be followed.  Do not place ice directly on skin. Make sure there is a barrier between to skin and the ice pack.    Using frozen items such as frozen peas works well as the conform nicely to the are that needs to be iced.  USE AN ACE WRAP  OR TED HOSE FOR SWELLING CONTROL  In addition to icing and elevation, Ace wraps or TED hose are used to help limit and resolve swelling.  It is recommended to use Ace wraps or TED hose until you are informed to stop.    When using Ace Wraps start the wrapping distally (farthest away from the body) and wrap proximally (closer to the body)   Example: If you had surgery on your leg or thing and you do not have a splint on, start the ace wrap at the toes and work your way up to the thigh        If you had surgery on your upper extremity and do not have a splint on, start the ace wrap at your fingers and work your way up to the upper arm   CALL THE OFFICE WITH ANY QUESTIONS OR CONCERNS: [phone]   VISIT OUR WEBSITE FOR ADDITIONAL INFORMATION: [email or web address]     Discharge Wound Care Instructions  Do NOT apply any ointments, solutions or lotions to pin sites or surgical wounds.  These prevent needed drainage and even though solutions like hydrogen peroxide kill bacteria, they also damage cells lining the pin sites that help fight infection.  Applying lotions or ointments can keep the wounds moist and can cause them to breakdown and open up as well. This can increase the risk for infection. When in doubt call the office.   If any drainage is noted, use gauze, Kerlix, and an ace wrap.  Once the incision is completely dry and without drainage, it may be left open to air out.  Showering may begin 36-48 hours later.  Cleaning gently with soap and water.

## 2020-12-17 NOTE — Anesthesia Postprocedure Evaluation (Signed)
Anesthesia Post Note  Patient: Pamela Santos  Procedure(s) Performed: INTRAMEDULLARY (IM) NAIL TIBIAL (Left)     Patient location during evaluation: PACU Anesthesia Type: General Level of consciousness: awake and alert Pain management: pain level controlled Vital Signs Assessment: post-procedure vital signs reviewed and stable Respiratory status: spontaneous breathing, nonlabored ventilation, respiratory function stable and patient connected to nasal cannula oxygen Cardiovascular status: blood pressure returned to baseline and stable Postop Assessment: no apparent nausea or vomiting Anesthetic complications: no   No notable events documented.  Last Vitals:  Vitals:   12/16/20 1115 12/16/20 1130  BP: (!) 87/56 (!) 92/58  Pulse: 94 84  Resp: 18 13  Temp:  36.7 C  SpO2: 100% 100%    Last Pain:  Vitals:   12/16/20 1115  TempSrc:   PainSc: 6                  Karlita Lichtman

## 2020-12-18 ENCOUNTER — Encounter (HOSPITAL_COMMUNITY): Payer: Self-pay | Admitting: Student

## 2021-01-06 ENCOUNTER — Encounter: Payer: Self-pay | Admitting: Obstetrics and Gynecology

## 2021-01-31 NOTE — L&D Delivery Note (Addendum)
Delivery Note ?Pt progressed w/o augmentation, had SROM (clear fluid).  After about an hour of pushing, at 3:41 PM a viable female was delivered via Vaginal, Spontaneous (Presentation: Left Occiput Anterior).  APGAR: 9, 9; weight pending. After 1 minute, the cord was clamped and cut. 20 units of pitocin diluted in 500cc LR was infused rapidly IV.  The placenta separated spontaneously and delivered via CCT and maternal pushing effort.  It was inspected and appears to be intact with a 3 VC.  ? ? ?Anesthesia: None ?Episiotomy:  none ?Lacerations:  1st degree, hemostatic ?Suture Repair:  ?Est. Blood Loss (mL):  200 ? ?Mom to postpartum.  Baby to Couplet care / Skin to Skin. ? ?Pamela Santos ?06/10/2021, 4:04 PM ? ? ? ?

## 2021-02-05 ENCOUNTER — Ambulatory Visit (INDEPENDENT_AMBULATORY_CARE_PROVIDER_SITE_OTHER): Payer: Self-pay | Admitting: Family

## 2021-02-05 ENCOUNTER — Encounter: Payer: Self-pay | Admitting: Family

## 2021-02-05 ENCOUNTER — Other Ambulatory Visit (HOSPITAL_COMMUNITY)
Admission: RE | Admit: 2021-02-05 | Discharge: 2021-02-05 | Disposition: A | Discharge status: Home / Self Care | Payer: Medicaid Other | Source: Ambulatory Visit | Attending: Obstetrics and Gynecology | Admitting: Obstetrics and Gynecology

## 2021-02-05 ENCOUNTER — Ambulatory Visit: Payer: No Typology Code available for payment source

## 2021-02-05 ENCOUNTER — Other Ambulatory Visit: Payer: Self-pay

## 2021-02-05 VITALS — BP 90/58 | HR 92 | Wt 125.7 lb

## 2021-02-05 DIAGNOSIS — O0990 Supervision of high risk pregnancy, unspecified, unspecified trimester: Secondary | ICD-10-CM | POA: Insufficient documentation

## 2021-02-05 NOTE — Progress Notes (Signed)
Patient ultrasound scheduled for 02/05/21 at 10 AM. Patient notified.

## 2021-02-05 NOTE — Progress Notes (Signed)
° °  PRENATAL VISIT NOTE  Subjective:  Pamela Santos is a 31 y.o. G1P0 at [redacted]w[redacted]d being seen today for new OB visit.  Recently moved her from [place] 2 months ago, lives with her brother. FOB still in [place].   She is currently monitored for the following issues:  None at this time for this low-risk pregnancy and has Tibia/fibula fracture, shaft, left, closed, initial encounter and Supervision of high risk pregnancy, antepartum on their problem list.  Patient reports no complaints, other than white discharge, no itching or odor.  Discharge described as thick in consistency, not watery. .  Contractions: Not present. Vag. Bleeding: None.  Movement: Present. Denies leaking of fluid.   The following portions of the patient's history were reviewed and updated as appropriate: allergies, current medications, past family history, past medical history, past social history, past surgical history and problem list.   Objective:   Vitals:   02/05/21 0900  BP: (!) 90/58  Pulse: 92  Weight: 125 lb 11.2 oz (57 kg)    Fetal Status: Fetal Heart Rate (bpm): 156   Movement: Present     General:  Alert, oriented and cooperative. Patient is in no acute distress.  Skin: Skin is warm and dry. No rash noted.   Cardiovascular: Normal heart rate noted  Respiratory: Normal respiratory effort, no problems with respiration noted  Abdomen: Soft, gravid, appropriate for gestational age.  Pain/Pressure: Absent     Pelvic: Cervical exam deferred until pap smear      Visualized vaginal area; discharge not watery, normal in appearance  Extremities: Normal range of motion.  Edema: None  Mental Status: Normal mood and affect. Normal behavior. Normal judgment and thought content.   Assessment and Plan:  Pregnancy: G1P0 at [redacted]w[redacted]d 1. Supervision of high risk pregnancy, antepartum - New OB labs - [place] MFM OB DETAIL +14 WK; Future - Navigation scheduled for today - Reviewed prenatal schedule (every 4 weeks, every 2 weeks, every  week)3  Preterm labor symptoms and general obstetric precautions including but not limited to vaginal bleeding, contractions, leaking of fluid and fetal movement were reviewed in detail with the patient. Please refer to After Visit Summary for other counseling recommendations.   Return in about 4 weeks (around 03/05/2021).  No future appointments.  Eino Farber [name], CNM

## 2021-02-05 NOTE — Progress Notes (Signed)
Patient reports white vaginal discharge. Denies any odor or itching. I offered her to do a "self swab" she accepted. Instructions were given and patient verbalized understanding.    Pamela Santos, CMA

## 2021-02-06 LAB — CERVICOVAGINAL ANCILLARY ONLY
Bacterial Vaginitis (gardnerella): POSITIVE — AB
Candida Glabrata: NEGATIVE
Candida Vaginitis: NEGATIVE
Chlamydia: NEGATIVE
Comment: NEGATIVE
Comment: NEGATIVE
Comment: NEGATIVE
Comment: NEGATIVE
Comment: NEGATIVE
Comment: NORMAL
Neisseria Gonorrhea: NEGATIVE
Trichomonas: NEGATIVE

## 2021-02-06 LAB — CBC/D/PLT+RPR+RH+ABO+RUBIGG...
Antibody Screen: NEGATIVE
Basophils Absolute: 0 10*3/uL (ref 0.0–0.2)
Basos: 1 %
EOS (ABSOLUTE): 0.2 10*3/uL (ref 0.0–0.4)
Eos: 2 %
HCV Ab: <0.1 s/co ratio (ref 0.0–0.9)
HIV Screen 4th Generation wRfx: NONREACTIVE
Hematocrit: 34.6 % (ref 34.0–46.6)
Hemoglobin: 11.6 g/dL (ref 11.1–15.9)
Hepatitis B Surface Ag: NEGATIVE
Immature Grans (Abs): 0.1 10*3/uL (ref 0.0–0.1)
Immature Granulocytes: 1 %
Lymphocytes Absolute: 2.7 10*3/uL (ref 0.7–3.1)
Lymphs: 38 %
MCH: 28.1 pg (ref 26.6–33.0)
MCHC: 33.5 g/dL (ref 31.5–35.7)
MCV: 84 fL (ref 79–97)
Monocytes Absolute: 0.6 10*3/uL (ref 0.1–0.9)
Monocytes: 8 %
Neutrophils Absolute: 3.6 10*3/uL (ref 1.4–7.0)
Neutrophils: 50 %
Platelets: 313 10*3/uL (ref 150–450)
RBC: 4.13 x10E6/uL (ref 3.77–5.28)
RDW: 13.2 % (ref 11.7–15.4)
RPR Ser Ql: NONREACTIVE
Rh Factor: NEGATIVE
Rubella Antibodies, IGG: 9.57 index (ref >0.99)
WBC: 7.1 10*3/uL (ref 3.4–10.8)

## 2021-02-06 LAB — HCV INTERPRETATION

## 2021-02-06 LAB — HEMOGLOBIN A1C
Est. average glucose Bld gHb Est-mCnc: 97 mg/dL
Hgb A1c MFr Bld: 5 % (ref 4.8–5.6)

## 2021-02-07 LAB — URINE CULTURE, OB REFLEX

## 2021-02-07 LAB — CULTURE, OB URINE

## 2021-02-22 ENCOUNTER — Ambulatory Visit: Payer: Medicaid Other | Attending: Family

## 2021-02-22 ENCOUNTER — Encounter: Payer: Self-pay | Admitting: *Deleted

## 2021-02-22 ENCOUNTER — Other Ambulatory Visit: Payer: Self-pay | Admitting: *Deleted

## 2021-02-22 ENCOUNTER — Other Ambulatory Visit: Payer: Self-pay

## 2021-02-22 ENCOUNTER — Ambulatory Visit: Payer: Medicaid Other | Admitting: *Deleted

## 2021-02-22 VITALS — BP 96/62 | HR 107

## 2021-02-22 DIAGNOSIS — O0990 Supervision of high risk pregnancy, unspecified, unspecified trimester: Secondary | ICD-10-CM | POA: Diagnosis present

## 2021-02-22 DIAGNOSIS — Z3689 Encounter for other specified antenatal screening: Secondary | ICD-10-CM

## 2021-02-22 DIAGNOSIS — Z6791 Unspecified blood type, Rh negative: Secondary | ICD-10-CM

## 2021-02-22 DIAGNOSIS — O26899 Other specified pregnancy related conditions, unspecified trimester: Secondary | ICD-10-CM

## 2021-02-22 DIAGNOSIS — Z362 Encounter for other antenatal screening follow-up: Secondary | ICD-10-CM

## 2021-03-05 ENCOUNTER — Encounter: Payer: Self-pay | Admitting: Obstetrics and Gynecology

## 2021-03-05 ENCOUNTER — Other Ambulatory Visit (HOSPITAL_COMMUNITY): Payer: Self-pay

## 2021-03-05 ENCOUNTER — Other Ambulatory Visit: Payer: Self-pay

## 2021-03-05 ENCOUNTER — Ambulatory Visit (INDEPENDENT_AMBULATORY_CARE_PROVIDER_SITE_OTHER): Payer: Medicaid Other | Admitting: Obstetrics and Gynecology

## 2021-03-05 VITALS — BP 103/70 | HR 91 | Wt 130.9 lb

## 2021-03-05 DIAGNOSIS — Z789 Other specified health status: Secondary | ICD-10-CM

## 2021-03-05 DIAGNOSIS — O26899 Other specified pregnancy related conditions, unspecified trimester: Secondary | ICD-10-CM

## 2021-03-05 DIAGNOSIS — B9689 Other specified bacterial agents as the cause of diseases classified elsewhere: Secondary | ICD-10-CM

## 2021-03-05 DIAGNOSIS — N760 Acute vaginitis: Secondary | ICD-10-CM

## 2021-03-05 DIAGNOSIS — R630 Anorexia: Secondary | ICD-10-CM

## 2021-03-05 DIAGNOSIS — O0990 Supervision of high risk pregnancy, unspecified, unspecified trimester: Secondary | ICD-10-CM

## 2021-03-05 DIAGNOSIS — O0930 Supervision of pregnancy with insufficient antenatal care, unspecified trimester: Secondary | ICD-10-CM | POA: Insufficient documentation

## 2021-03-05 DIAGNOSIS — Z6791 Unspecified blood type, Rh negative: Secondary | ICD-10-CM

## 2021-03-05 MED ORDER — METRONIDAZOLE 500 MG PO TABS: 500.0000 mg | ORAL_TABLET | Freq: Two times a day (BID) | ORAL | 0 refills | Status: AC

## 2021-03-05 MED ORDER — DOXYLAMINE SUCCINATE (SLEEP) 25 MG PO TABS: 25.0000 mg | ORAL_TABLET | Freq: Every evening | ORAL | 0 refills | Status: DC | PRN

## 2021-03-05 MED ORDER — PROMETHAZINE HCL 25 MG PO TABS: 25.0000 mg | ORAL_TABLET | Freq: Four times a day (QID) | ORAL | 1 refills | Status: DC | PRN

## 2021-03-05 MED ORDER — METRONIDAZOLE 500 MG PO TABS
500.0000 mg | ORAL_TABLET | Freq: Two times a day (BID) | ORAL | 0 refills | Status: DC
  Filled 2021-03-05: qty 14, 7d supply, fill #0

## 2021-03-05 NOTE — Progress Notes (Signed)
Pt states poor appetite & problems falling asleep.

## 2021-03-06 DIAGNOSIS — O26899 Other specified pregnancy related conditions, unspecified trimester: Secondary | ICD-10-CM

## 2021-03-06 DIAGNOSIS — Z789 Other specified health status: Secondary | ICD-10-CM | POA: Insufficient documentation

## 2021-03-06 DIAGNOSIS — Z603 Acculturation difficulty: Secondary | ICD-10-CM | POA: Insufficient documentation

## 2021-03-06 DIAGNOSIS — Z6791 Unspecified blood type, Rh negative: Secondary | ICD-10-CM | POA: Insufficient documentation

## 2021-03-06 HISTORY — DX: Other specified pregnancy related conditions, unspecified trimester: O26.899

## 2021-03-06 HISTORY — DX: Unspecified blood type, rh negative: Z67.91

## 2021-03-06 NOTE — Progress Notes (Signed)
° °  PRENATAL VISIT NOTE  Subjective:  Pamela Santos is a 31 y.o. G1P0 at [redacted]w[redacted]d being seen today for ongoing prenatal care.  She is currently monitored for the following issues for this low-risk pregnancy and has Tibia/fibula fracture, shaft, left, closed, initial encounter; Supervision of high risk pregnancy, antepartum; Late prenatal care; and Language barrier on their problem list.  Patient reports  see below .  Contractions: Irritability. Vag. Bleeding: None.  Movement: Present. Denies leaking of fluid.   The following portions of the patient's history were reviewed and updated as appropriate: allergies, current medications, past family history, past medical history, past social history, past surgical history and problem list.   Objective:   Vitals:   03/05/21 1026  BP: 103/70  Pulse: 91  Weight: 130 lb 14.4 oz (59.4 kg)    Fetal Status: Fetal Heart Rate (bpm): 152   Movement: Present     General:  Alert, oriented and cooperative. Patient is in no acute distress.  Skin: Skin is warm and dry. No rash noted.   Cardiovascular: Normal heart rate noted  Respiratory: Normal respiratory effort, no problems with respiration noted  Abdomen: Soft, gravid, appropriate for gestational age.  Pain/Pressure: Absent     Pelvic: Cervical exam deferred        Extremities: Normal range of motion.  Edema: None  Mental Status: Normal mood and affect. Normal behavior. Normal judgment and thought content.   Assessment and Plan:  Pregnancy: G1P0 at [redacted]w[redacted]d 1. Supervision of high risk pregnancy, antepartum 28wk labs nv. F/u 2/20 completion anatomy u/s  2. BV (bacterial vaginosis) Pt states she didn't receive treatment for this and is wondering about it. I told her I don't feel she needs treatment since she denies any s/s, but she would like treatment. Flagyl sent in - metroNIDAZOLE (FLAGYL) 500 MG tablet; Take 1 tablet (500 mg total) by mouth 2 (two) times daily.  Dispense: 14 tablet; Refill: 0  3.  Poor appetite 5lbs increase since last visit. Efw 28% and ac 27% at her 1/23 anatomy u/s. Pt denies any gerd and only rare n/v. Phenergan prn sent in and I d/w her re: six small meals and snacking throughout the day  4. Late prenatal care  5. Language barrier Interpreter used  6. Rh negative Rhogam and ab screen nv  Preterm labor symptoms and general obstetric precautions including but not limited to vaginal bleeding, contractions, leaking of fluid and fetal movement were reviewed in detail with the patient. Please refer to After Visit Summary for other counseling recommendations.   Return in about 2 weeks (around 03/19/2021) for in person, fasting 2hr GTT, low risk ob, md or app.  Future Appointments  Date Time Provider Department Center  03/22/2021  8:30 AM [facility] NURSE [facility]  03/22/2021  8:45 AM [facility] US[facility]  03/25/2021  8:20 AM [facility] LAB [facility]  03/25/2021  9:55 AM Brand Males, CNM [facility]    [place], MD

## 2021-03-22 ENCOUNTER — Ambulatory Visit: Payer: Medicaid Other | Attending: Obstetrics and Gynecology

## 2021-03-22 ENCOUNTER — Ambulatory Visit: Payer: Medicaid Other | Admitting: *Deleted

## 2021-03-22 ENCOUNTER — Other Ambulatory Visit: Payer: Self-pay

## 2021-03-22 ENCOUNTER — Encounter: Payer: Self-pay | Admitting: *Deleted

## 2021-03-22 VITALS — BP 98/61 | HR 78

## 2021-03-22 DIAGNOSIS — Z6791 Unspecified blood type, Rh negative: Secondary | ICD-10-CM | POA: Insufficient documentation

## 2021-03-22 DIAGNOSIS — O26899 Other specified pregnancy related conditions, unspecified trimester: Secondary | ICD-10-CM | POA: Insufficient documentation

## 2021-03-22 DIAGNOSIS — Z3A28 28 weeks gestation of pregnancy: Secondary | ICD-10-CM

## 2021-03-22 DIAGNOSIS — O0933 Supervision of pregnancy with insufficient antenatal care, third trimester: Secondary | ICD-10-CM | POA: Diagnosis present

## 2021-03-22 DIAGNOSIS — Z362 Encounter for other antenatal screening follow-up: Secondary | ICD-10-CM | POA: Diagnosis not present

## 2021-03-25 ENCOUNTER — Other Ambulatory Visit: Payer: Self-pay

## 2021-03-25 ENCOUNTER — Ambulatory Visit (INDEPENDENT_AMBULATORY_CARE_PROVIDER_SITE_OTHER): Payer: Medicaid Other

## 2021-03-25 ENCOUNTER — Other Ambulatory Visit: Payer: Medicaid Other

## 2021-03-25 VITALS — BP 96/69 | HR 96 | Wt 136.0 lb

## 2021-03-25 DIAGNOSIS — O26899 Other specified pregnancy related conditions, unspecified trimester: Secondary | ICD-10-CM | POA: Diagnosis not present

## 2021-03-25 DIAGNOSIS — O0990 Supervision of high risk pregnancy, unspecified, unspecified trimester: Secondary | ICD-10-CM

## 2021-03-25 DIAGNOSIS — Z789 Other specified health status: Secondary | ICD-10-CM

## 2021-03-25 DIAGNOSIS — Z23 Encounter for immunization: Secondary | ICD-10-CM | POA: Diagnosis not present

## 2021-03-25 DIAGNOSIS — Z6791 Unspecified blood type, Rh negative: Secondary | ICD-10-CM

## 2021-03-25 MED ORDER — RHO D IMMUNE GLOBULIN 1500 UNIT/2ML IJ SOSY
300.0000 ug | PREFILLED_SYRINGE | Freq: Once | INTRAMUSCULAR | Status: AC
  Administered 2021-03-25: 300 ug via INTRAMUSCULAR

## 2021-03-25 MED ORDER — PREPLUS 27-1 MG PO TABS: 1.0000 | ORAL_TABLET | Freq: Every day | ORAL | 13 refills | Status: DC

## 2021-03-25 NOTE — Patient Instructions (Signed)
Yogi Bedtime Tea Celestial Sleepytime Tea

## 2021-03-25 NOTE — Progress Notes (Signed)
Rhogam given in left upper outer quadrant. Patient tolerated injection and there were no complications. No questions or concerns.   Zella Richer, [place]   03/25/21

## 2021-03-25 NOTE — Progress Notes (Signed)
° °  PRENATAL VISIT NOTE  Subjective:  Pamela Santos is a 31 y.o. G1P0 at [redacted]w[redacted]d being seen today for ongoing prenatal care.  She is currently monitored for the following issues for this low-risk pregnancy and has Tibia/fibula fracture, shaft, left, closed, initial encounter; Supervision of high risk pregnancy, antepartum; Late prenatal care; Language barrier; and Rh negative state in antepartum period on their problem list.  Patient reports no complaints.  Contractions: Irritability. Vag. Bleeding: None.  Movement: Present. Denies leaking of fluid.   The following portions of the patient's history were reviewed and updated as appropriate: allergies, current medications, past family history, past medical history, past social history, past surgical history and problem list.   Objective:   Vitals:   03/25/21 1009  BP: 96/69  Pulse: 96  Weight: 136 lb (61.7 kg)    Fetal Status: Fetal Heart Rate (bpm): 143 Fundal Height: 28 cm Movement: Present     General:  Alert, oriented and cooperative. Patient is in no acute distress.  Skin: Skin is warm and dry. No rash noted.   Cardiovascular: Normal heart rate noted  Respiratory: Normal respiratory effort, no problems with respiration noted  Abdomen: Soft, gravid, appropriate for gestational age.  Pain/Pressure: Absent     Pelvic: Cervical exam deferred        Extremities: Normal range of motion.  Edema: None  Mental Status: Normal mood and affect. Normal behavior. Normal judgment and thought content.   Assessment and Plan:  Pregnancy: G1P0 at [redacted]w[redacted]d 1. Supervision of high risk pregnancy, antepartum - Routine OB. Doing well. Occasional braxton hicks contractions - Difficulty sleeping-ongoing issue. Sleep habits reviewed. May try sleepy time tea or benadryl prn.  - Tdap today - GTT scheduled for next week - Anticipatory guidance for upcoming appointments provided  - Tdap vaccine greater than or equal to 7yo IM - rho (d) immune globulin  (RHIG/RHOPHYLAC) injection 300 mcg  2. Rh negative state in antepartum period  - rho (d) immune globulin (RHIG/RHOPHYLAC) injection 300 mcg  3. Language barrier - Live [place] interpretor used for today's visit   Preterm labor symptoms and general obstetric precautions including but not limited to vaginal bleeding, contractions, leaking of fluid and fetal movement were reviewed in detail with the patient. Please refer to After Visit Summary for other counseling recommendations.   Return in about 2 weeks (around 04/08/2021).  Future Appointments  Date Time Provider Department Center  04/01/2021  8:50 AM [facility]  04/09/2021  8:35 AM Warden Fillers, MD [facility]    Brand Males, CNM

## 2021-04-01 ENCOUNTER — Other Ambulatory Visit: Payer: Self-pay

## 2021-04-01 ENCOUNTER — Other Ambulatory Visit: Payer: Medicaid Other

## 2021-04-01 DIAGNOSIS — O0990 Supervision of high risk pregnancy, unspecified, unspecified trimester: Secondary | ICD-10-CM

## 2021-04-02 LAB — GLUCOSE TOLERANCE, 2 HOURS W/ 1HR
Glucose, 1 hour: 110 mg/dL (ref 70–179)
Glucose, 2 hour: 78 mg/dL (ref 70–152)
Glucose, Fasting: 71 mg/dL (ref 70–91)

## 2021-04-05 LAB — CBC
Hematocrit: 32.1 % — ABNORMAL LOW (ref 34.0–46.6)
Hemoglobin: 10.7 g/dL — ABNORMAL LOW (ref 11.1–15.9)
MCH: 26.8 pg (ref 26.6–33.0)
MCHC: 33.3 g/dL (ref 31.5–35.7)
MCV: 81 fL (ref 79–97)
Platelets: 268 10*3/uL (ref 150–450)
RBC: 3.99 x10E6/uL (ref 3.77–5.28)
RDW: 13 % (ref 11.7–15.4)
WBC: 6.9 10*3/uL (ref 3.4–10.8)

## 2021-04-05 LAB — AB SCR+ANTIBODY ID: Antibody Screen: POSITIVE — AB

## 2021-04-05 LAB — HIV ANTIBODY (ROUTINE TESTING W REFLEX): HIV Screen 4th Generation wRfx: NONREACTIVE

## 2021-04-05 LAB — RPR: RPR Ser Ql: NONREACTIVE

## 2021-04-05 LAB — ANTIBODY SCREEN

## 2021-04-09 ENCOUNTER — Ambulatory Visit (INDEPENDENT_AMBULATORY_CARE_PROVIDER_SITE_OTHER): Payer: Medicaid Other | Admitting: Obstetrics and Gynecology

## 2021-04-09 ENCOUNTER — Other Ambulatory Visit: Payer: Self-pay

## 2021-04-09 VITALS — BP 87/51 | HR 107 | Wt 134.0 lb

## 2021-04-09 DIAGNOSIS — Z3A30 30 weeks gestation of pregnancy: Secondary | ICD-10-CM

## 2021-04-09 DIAGNOSIS — Z6791 Unspecified blood type, Rh negative: Secondary | ICD-10-CM

## 2021-04-09 DIAGNOSIS — O26899 Other specified pregnancy related conditions, unspecified trimester: Secondary | ICD-10-CM

## 2021-04-09 DIAGNOSIS — O0990 Supervision of high risk pregnancy, unspecified, unspecified trimester: Secondary | ICD-10-CM

## 2021-04-09 DIAGNOSIS — Z789 Other specified health status: Secondary | ICD-10-CM

## 2021-04-09 NOTE — Progress Notes (Signed)
? ?  PRENATAL VISIT NOTE ? ?Subjective:  ?Pamela Santos is a 31 y.o. G1P0 at [redacted]w[redacted]d being seen today for ongoing prenatal care.  She is currently monitored for the following issues for this low-risk pregnancy and has Tibia/fibula fracture, shaft, left, closed, initial encounter; Supervision of high risk pregnancy, antepartum; Late prenatal care; Language barrier; and Rh negative state in antepartum period on their problem list. ? ?Patient doing well with no acute concerns today. She reports fatigue and occasional dizziness .  Contractions: Irritability. Vag. Bleeding: None.  Movement: Present. Denies leaking of fluid.  ? ?The following portions of the patient's history were reviewed and updated as appropriate: allergies, current medications, past family history, past medical history, past social history, past surgical history and problem list. Problem list updated. ? ?Objective:  ? ?Vitals:  ? 04/09/21 0847  ?BP: (!) 87/51  ?Pulse: (!) 107  ?Weight: 134 lb (60.8 kg)  ? ? ?Fetal Status: Fetal Heart Rate (bpm): 153 Fundal Height: 31 cm Movement: Present    ? ?General:  Alert, oriented and cooperative. Patient is in no acute distress.  ?Skin: Skin is warm and dry. No rash noted.   ?Cardiovascular: Normal heart rate noted  ?Respiratory: Normal respiratory effort, no problems with respiration noted  ?Abdomen: Soft, gravid, appropriate for gestational age.  Pain/Pressure: Present     ?Pelvic: Cervical exam deferred        ?Extremities: Normal range of motion.  Edema: None  ?Mental Status:  Normal mood and affect. Normal behavior. Normal judgment and thought content.  ? ?Assessment and Plan:  ?Pregnancy: G1P0 at [redacted]w[redacted]d ? ?1. [redacted] weeks gestation of pregnancy ? ? ?2. Supervision of high risk pregnancy, antepartum ?Continue routine care, pt advised to eat three main meal with snacks, increase hydration and to allow more time for position changes to address dizziness ? ?3. Rh negative state in antepartum period ?Rhogam after  delivery ? ?4. Language barrier ?Live interpreter present ? ?Preterm labor symptoms and general obstetric precautions including but not limited to vaginal bleeding, contractions, leaking of fluid and fetal movement were reviewed in detail with the patient. ? ?Please refer to After Visit Summary for other counseling recommendations.  ? ?Return in about 2 weeks (around 04/23/2021) for ROB, in person. ? ? ?Mariel Aloe, MD ?Faculty Attending ?Center for [facility] ?  ?

## 2021-04-26 ENCOUNTER — Ambulatory Visit (INDEPENDENT_AMBULATORY_CARE_PROVIDER_SITE_OTHER): Payer: Medicaid Other | Admitting: Obstetrics and Gynecology

## 2021-04-26 ENCOUNTER — Other Ambulatory Visit: Payer: Self-pay

## 2021-04-26 VITALS — BP 106/74 | HR 102 | Wt 137.4 lb

## 2021-04-26 DIAGNOSIS — Z3A33 33 weeks gestation of pregnancy: Secondary | ICD-10-CM

## 2021-04-26 DIAGNOSIS — O0930 Supervision of pregnancy with insufficient antenatal care, unspecified trimester: Secondary | ICD-10-CM

## 2021-04-26 DIAGNOSIS — O0990 Supervision of high risk pregnancy, unspecified, unspecified trimester: Secondary | ICD-10-CM

## 2021-04-26 DIAGNOSIS — O26899 Other specified pregnancy related conditions, unspecified trimester: Secondary | ICD-10-CM

## 2021-04-26 DIAGNOSIS — Z6791 Unspecified blood type, Rh negative: Secondary | ICD-10-CM

## 2021-04-26 DIAGNOSIS — Z603 Acculturation difficulty: Secondary | ICD-10-CM

## 2021-04-26 DIAGNOSIS — Z789 Other specified health status: Secondary | ICD-10-CM

## 2021-04-26 NOTE — Progress Notes (Signed)
? ?  PRENATAL VISIT NOTE ? ?Subjective:  ?Pamela Santos is a 31 y.o. G1P0 at [redacted]w[redacted]d being seen today for ongoing prenatal care.  She is currently monitored for the following issues for this low-risk pregnancy and has Tibia/fibula fracture, shaft, left, closed, initial encounter; Supervision of high risk pregnancy, antepartum; Late prenatal care; Language barrier; and Rh negative state in antepartum period on their problem list. ? ?Patient doing well with no acute concerns today. She reports no complaints.  Contractions: Irritability. Vag. Bleeding: None.  Movement: Present. Denies leaking of fluid.  ? ?The following portions of the patient's history were reviewed and updated as appropriate: allergies, current medications, past family history, past medical history, past social history, past surgical history and problem list. Problem list updated. ? ?Objective:  ? ?Vitals:  ? 04/26/21 1423  ?BP: 106/74  ?Pulse: (!) 102  ?Weight: 137 lb 6.4 oz (62.3 kg)  ? ? ?Fetal Status: Fetal Heart Rate (bpm): 147 Fundal Height: 33 cm Movement: Present    ? ?General:  Alert, oriented and cooperative. Patient is in no acute distress.  ?Skin: Skin is warm and dry. No rash noted.   ?Cardiovascular: Normal heart rate noted  ?Respiratory: Normal respiratory effort, no problems with respiration noted  ?Abdomen: Soft, gravid, appropriate for gestational age.  Pain/Pressure: Present     ?Pelvic: Cervical exam deferred        ?Extremities: Normal range of motion.  Edema: None  ?Mental Status:  Normal mood and affect. Normal behavior. Normal judgment and thought content.  ? ?Assessment and Plan:  ?Pregnancy: G1P0 at [redacted]w[redacted]d ? ?1. [redacted] weeks gestation of pregnancy ? ? ?2. Supervision of high risk pregnancy, antepartum ?Continue routine care ? ?3. Late prenatal care ? ? ?4. Language barrier ?Live interpreter ? ?5. Rh negative state in antepartum period ?Rhogam after delivery ? ?Preterm labor symptoms and general obstetric precautions including but  not limited to vaginal bleeding, contractions, leaking of fluid and fetal movement were reviewed in detail with the patient. ? ?Please refer to After Visit Summary for other counseling recommendations.  ? ?Return in about 2 weeks (around 05/10/2021) for ROB, in person. ? ? ?Mariel Aloe, MD ?Faculty Attending ?Center for [facility] ?  ?

## 2021-05-14 ENCOUNTER — Ambulatory Visit (INDEPENDENT_AMBULATORY_CARE_PROVIDER_SITE_OTHER): Payer: Medicaid Other | Admitting: Obstetrics and Gynecology

## 2021-05-14 VITALS — BP 100/86 | HR 89 | Wt 142.7 lb

## 2021-05-14 DIAGNOSIS — K5900 Constipation, unspecified: Secondary | ICD-10-CM

## 2021-05-14 DIAGNOSIS — O0930 Supervision of pregnancy with insufficient antenatal care, unspecified trimester: Secondary | ICD-10-CM

## 2021-05-14 DIAGNOSIS — Z758 Other problems related to medical facilities and other health care: Secondary | ICD-10-CM

## 2021-05-14 DIAGNOSIS — O99613 Diseases of the digestive system complicating pregnancy, third trimester: Secondary | ICD-10-CM

## 2021-05-14 DIAGNOSIS — Z6791 Unspecified blood type, Rh negative: Secondary | ICD-10-CM

## 2021-05-14 DIAGNOSIS — O26899 Other specified pregnancy related conditions, unspecified trimester: Secondary | ICD-10-CM

## 2021-05-14 DIAGNOSIS — Z789 Other specified health status: Secondary | ICD-10-CM

## 2021-05-14 DIAGNOSIS — Z3A35 35 weeks gestation of pregnancy: Secondary | ICD-10-CM

## 2021-05-14 NOTE — Progress Notes (Signed)
Not sleeping well, Unisom does not work & a lot of bloating & gas. ?

## 2021-05-14 NOTE — Patient Instructions (Signed)
Metamucil (psyllum) fiber ? ?Pregnancy pillow ?

## 2021-05-15 NOTE — Progress Notes (Signed)
? ?  PRENATAL VISIT NOTE ? ?Subjective:  ?Pamela Santos is a 31 y.o. G1P0 at [redacted]w[redacted]d being seen today for ongoing prenatal care.  She is currently monitored for the following issues for this low-risk pregnancy and has Tibia/fibula fracture, shaft, left, closed, initial encounter; Supervision of high risk pregnancy, antepartum; Late prenatal care; Language barrier; and Rh negative state in antepartum period on their problem list. ? ?Patient reports  constipation, difficulty falling asleep; has tried unisom .  Contractions: Irritability. Vag. Bleeding: None.  Movement: Present. Denies leaking of fluid.  ? ?The following portions of the patient's history were reviewed and updated as appropriate: allergies, current medications, past family history, past medical history, past social history, past surgical history and problem list.  ? ?Objective:  ? ?Vitals:  ? 05/14/21 0848  ?BP: 100/86  ?Pulse: 89  ?Weight: 142 lb 11.2 oz (64.7 kg)  ? ? ?Fetal Status: Fetal Heart Rate (bpm): 141 Fundal Height: 35 cm Movement: Present  Presentation: Vertex ? ?General:  Alert, oriented and cooperative. Patient is in no acute distress.  ?Skin: Skin is warm and dry. No rash noted.   ?Cardiovascular: Normal heart rate noted  ?Respiratory: Normal respiratory effort, no problems with respiration noted  ?Abdomen: Soft, gravid, appropriate for gestational age.  Pain/Pressure: Present     ?Pelvic: Cervical exam deferred        ?Extremities: Normal range of motion.  Edema: None  ?Mental Status: Normal mood and affect. Normal behavior. Normal judgment and thought content.  ? ?Assessment and Plan:  ?Pregnancy: G1P0 at [redacted]w[redacted]d ?1. Constipation during pregnancy in third trimester ?Recommend metamucil or miralax bid ? ?2. Rh negative state in antepartum period ?S/p rhogam on 2/23 ? ?3. Late prenatal care ? ?4. Language barrier ?Interpreter used ? ?5. [redacted] weeks gestation of pregnancy ?Gbs nv ?Sleep hygiene techniques d/w her ? ?Preterm labor symptoms and  general obstetric precautions including but not limited to vaginal bleeding, contractions, leaking of fluid and fetal movement were reviewed in detail with the patient. ?Please refer to After Visit Summary for other counseling recommendations.  ? ?Return in about 1 week (around 05/21/2021) for 7-10d, low risk ob, in person, md or app. ? ?Future Appointments  ?Date Time Provider [place]  ?05/21/2021 10:35 AM Aletha Halim, MD [facility]  ? ? ?Aletha Halim, MD ? ?

## 2021-05-21 ENCOUNTER — Ambulatory Visit (INDEPENDENT_AMBULATORY_CARE_PROVIDER_SITE_OTHER): Payer: Medicaid Other | Admitting: Obstetrics and Gynecology

## 2021-05-21 ENCOUNTER — Other Ambulatory Visit (HOSPITAL_COMMUNITY)
Admission: RE | Admit: 2021-05-21 | Discharge: 2021-05-21 | Disposition: A | Discharge status: Home / Self Care | Payer: Medicaid Other | Source: Ambulatory Visit | Attending: Obstetrics and Gynecology | Admitting: Obstetrics and Gynecology

## 2021-05-21 VITALS — BP 97/72 | HR 94 | Wt 141.7 lb

## 2021-05-21 DIAGNOSIS — O0990 Supervision of high risk pregnancy, unspecified, unspecified trimester: Secondary | ICD-10-CM | POA: Insufficient documentation

## 2021-05-21 DIAGNOSIS — Z789 Other specified health status: Secondary | ICD-10-CM

## 2021-05-21 DIAGNOSIS — O26899 Other specified pregnancy related conditions, unspecified trimester: Secondary | ICD-10-CM

## 2021-05-21 DIAGNOSIS — Z3A36 36 weeks gestation of pregnancy: Secondary | ICD-10-CM

## 2021-05-21 DIAGNOSIS — Z6791 Unspecified blood type, Rh negative: Secondary | ICD-10-CM

## 2021-05-23 NOTE — Progress Notes (Signed)
? ?  PRENATAL VISIT NOTE ? ?Subjective:  ?Pamela Santos is a 31 y.o. G1P0 at [redacted]w[redacted]d being seen today for ongoing prenatal care.  She is currently monitored for the following issues for this high-risk pregnancy and has Tibia/fibula fracture, shaft, left, closed, initial encounter; Supervision of high risk pregnancy, antepartum; Late prenatal care; Language barrier; and Rh negative state in antepartum period on their problem list. ? ?Patient reports no complaints.  Contractions: Irritability. Vag. Bleeding: None.  Movement: Present. Denies leaking of fluid.  ? ?The following portions of the patient's history were reviewed and updated as appropriate: allergies, current medications, past family history, past medical history, past social history, past surgical history and problem list.  ? ?Objective:  ? ?Vitals:  ? 05/21/21 1051  ?BP: 97/72  ?Pulse: 94  ?Weight: 141 lb 11.2 oz (64.3 kg)  ? ? ?Fetal Status: Fetal Heart Rate (bpm): 146 Fundal Height: 37 cm Movement: Present  Presentation: Vertex ? ?General:  Alert, oriented and cooperative. Patient is in no acute distress.  ?Skin: Skin is warm and dry. No rash noted.   ?Cardiovascular: Normal heart rate noted  ?Respiratory: Normal respiratory effort, no problems with respiration noted  ?Abdomen: Soft, gravid, appropriate for gestational age.  Pain/Pressure: Present     ?Pelvic: Cervical exam performed in the presence of a chaperone Dilation: Closed Effacement (%): 50 Station: -3  ?Extremities: Normal range of motion.  Edema: Trace  ?Mental Status: Normal mood and affect. Normal behavior. Normal judgment and thought content.  ? ?Assessment and Plan:  ?Pregnancy: G1P0 at [redacted]w[redacted]d ?1. Supervision of high risk pregnancy, antepartum ?D/w her re: possible BC options ?- GC/Chlamydia probe amp ([place])not at [facility] ?- Culture, beta strep (group b only) ? ?2. [redacted] weeks gestation of pregnancy ? ?3. Language barrier ?Interpreter used ? ?4. Rh negative state in antepartum period ?Rhogam  pp prn ? ?Preterm labor symptoms and general obstetric precautions including but not limited to vaginal bleeding, contractions, leaking of fluid and fetal movement were reviewed in detail with the patient. ?Please refer to After Visit Summary for other counseling recommendations.  ? ?Return in about 1 week (around 05/28/2021) for in person, low risk ob, md or app. ? ?Future Appointments  ?Date Time Provider [place]  ?05/28/2021  8:35 AM Danielle Rankin [facility]  ?06/04/2021  8:55 AM Hillard Danker Myles Rosenthal, PA-C [facility]  ?06/11/2021  8:15 AM Donnamae Jude, MD [facility]  ? ? ?Aletha Halim, MD ? ?

## 2021-05-24 LAB — GC/CHLAMYDIA PROBE AMP (~~LOC~~) NOT AT ARMC
Chlamydia: NEGATIVE
Comment: NEGATIVE
Comment: NORMAL
Neisseria Gonorrhea: NEGATIVE

## 2021-05-25 ENCOUNTER — Encounter: Payer: Self-pay | Admitting: Obstetrics and Gynecology

## 2021-05-25 DIAGNOSIS — O99820 Streptococcus B carrier state complicating pregnancy: Secondary | ICD-10-CM | POA: Insufficient documentation

## 2021-05-25 LAB — CULTURE, BETA STREP (GROUP B ONLY): Strep Gp B Culture: POSITIVE — AB

## 2021-05-28 ENCOUNTER — Ambulatory Visit (INDEPENDENT_AMBULATORY_CARE_PROVIDER_SITE_OTHER): Payer: 59 | Admitting: Medical

## 2021-05-28 VITALS — BP 95/72 | HR 94 | Wt 142.0 lb

## 2021-05-28 DIAGNOSIS — O26899 Other specified pregnancy related conditions, unspecified trimester: Secondary | ICD-10-CM

## 2021-05-28 DIAGNOSIS — Z5941 Food insecurity: Secondary | ICD-10-CM

## 2021-05-28 DIAGNOSIS — Z3A37 37 weeks gestation of pregnancy: Secondary | ICD-10-CM

## 2021-05-28 DIAGNOSIS — O99820 Streptococcus B carrier state complicating pregnancy: Secondary | ICD-10-CM

## 2021-05-28 DIAGNOSIS — O0930 Supervision of pregnancy with insufficient antenatal care, unspecified trimester: Secondary | ICD-10-CM

## 2021-05-28 DIAGNOSIS — Z789 Other specified health status: Secondary | ICD-10-CM

## 2021-05-28 DIAGNOSIS — O0990 Supervision of high risk pregnancy, unspecified, unspecified trimester: Secondary | ICD-10-CM

## 2021-05-28 DIAGNOSIS — Z6791 Unspecified blood type, Rh negative: Secondary | ICD-10-CM

## 2021-05-28 NOTE — Progress Notes (Signed)
? ?  PRENATAL VISIT NOTE ? ?Subjective:  ?Pamela Santos is a 31 y.o. G1P0 at [redacted]w[redacted]d being seen today for ongoing prenatal care.  She is currently monitored for the following issues for this high-risk pregnancy and has Tibia/fibula fracture, shaft, left, closed, initial encounter; Supervision of high risk pregnancy, antepartum; Late prenatal care; Language barrier; Rh negative state in antepartum period; and GBS (group B Streptococcus carrier), +RV culture, currently pregnant on their problem list. ? ?Patient reports no complaints.  Contractions: Irritability. Vag. Bleeding: None.  Movement: Present. Denies leaking of fluid.  ? ?The following portions of the patient's history were reviewed and updated as appropriate: allergies, current medications, past family history, past medical history, past social history, past surgical history and problem list.  ? ?Objective:  ? ?Vitals:  ? 05/28/21 0841  ?BP: 95/72  ?Pulse: 94  ?Weight: 142 lb (64.4 kg)  ? ? ?Fetal Status: Fetal Heart Rate (bpm): 144 Fundal Height: 37 cm Movement: Present    ? ?General:  Alert, oriented and cooperative. Patient is in no acute distress.  ?Skin: Skin is warm and dry. No rash noted.   ?Cardiovascular: Normal heart rate noted  ?Respiratory: Normal respiratory effort, no problems with respiration noted  ?Abdomen: Soft, gravid, appropriate for gestational age.  Pain/Pressure: Present     ?Pelvic: Cervical exam deferred        ?Extremities: Normal range of motion.  Edema: Trace  ?Mental Status: Normal mood and affect. Normal behavior. Normal judgment and thought content.  ? ?Assessment and Plan:  ?Pregnancy: G1P0 at [redacted]w[redacted]d ?1. Food insecurity ?- AMBULATORY REFERRAL TO [facility] ? ?2. Supervision of high risk pregnancy, antepartum ?- Patient is not planning to use birth control  ?- Planning TAPM for Peds  ? ?3. Rh negative state in antepartum period ?- Rhogam 03/25/21 ? ?4. Language barrier ?- Interpreter used ? ?5. GBS (group B Streptococcus  carrier), +RV culture, currently pregnant ?- Treat in labor ? ?6. Late prenatal care ? ?7. [redacted] weeks gestation of pregnancy ? ?Term labor symptoms and general obstetric precautions including but not limited to vaginal bleeding, contractions, leaking of fluid and fetal movement were reviewed in detail with the patient. ?Please refer to After Visit Summary for other counseling recommendations.  ? ?Return in about 1 week (around 06/04/2021) for LOB, In-Person. ? ?Future Appointments  ?Date Time Provider [place]  ?06/04/2021  8:55 AM Hillard Danker Myles Rosenthal, PA-C [facility]  ?06/11/2021  8:15 AM Donnamae Jude, MD [facility]  ? ? ?Kerry Hough, PA-C ? ?

## 2021-05-28 NOTE — Addendum Note (Signed)
Addended by: Guy Begin on: 05/28/2021 09:48 AM ? ? Modules accepted: Orders ? ?

## 2021-05-28 NOTE — Addendum Note (Signed)
Addended by: Guy Begin on: 05/28/2021 09:41 AM ? ? Modules accepted: Orders ? ?

## 2021-05-28 NOTE — Progress Notes (Signed)
Patient reports lower back. She describes the pain as "sharp" ?

## 2021-06-04 ENCOUNTER — Ambulatory Visit (INDEPENDENT_AMBULATORY_CARE_PROVIDER_SITE_OTHER): Payer: 59 | Admitting: Medical

## 2021-06-04 ENCOUNTER — Ambulatory Visit: Payer: Medicaid Other | Attending: Obstetrics and Gynecology

## 2021-06-04 ENCOUNTER — Other Ambulatory Visit: Payer: Self-pay | Admitting: Medical

## 2021-06-04 ENCOUNTER — Encounter: Payer: Self-pay | Admitting: *Deleted

## 2021-06-04 ENCOUNTER — Ambulatory Visit: Payer: Medicaid Other | Admitting: *Deleted

## 2021-06-04 ENCOUNTER — Encounter: Payer: Self-pay | Admitting: Medical

## 2021-06-04 VITALS — BP 101/66 | HR 83

## 2021-06-04 VITALS — BP 104/78 | HR 77 | Wt 144.3 lb

## 2021-06-04 DIAGNOSIS — O0933 Supervision of pregnancy with insufficient antenatal care, third trimester: Secondary | ICD-10-CM | POA: Diagnosis present

## 2021-06-04 DIAGNOSIS — Z603 Acculturation difficulty: Secondary | ICD-10-CM

## 2021-06-04 DIAGNOSIS — O0990 Supervision of high risk pregnancy, unspecified, unspecified trimester: Secondary | ICD-10-CM | POA: Diagnosis not present

## 2021-06-04 DIAGNOSIS — Z789 Other specified health status: Secondary | ICD-10-CM

## 2021-06-04 DIAGNOSIS — O26899 Other specified pregnancy related conditions, unspecified trimester: Secondary | ICD-10-CM

## 2021-06-04 DIAGNOSIS — Z3A38 38 weeks gestation of pregnancy: Secondary | ICD-10-CM

## 2021-06-04 DIAGNOSIS — O0930 Supervision of pregnancy with insufficient antenatal care, unspecified trimester: Secondary | ICD-10-CM

## 2021-06-04 DIAGNOSIS — Z6791 Unspecified blood type, Rh negative: Secondary | ICD-10-CM

## 2021-06-04 DIAGNOSIS — O99820 Streptococcus B carrier state complicating pregnancy: Secondary | ICD-10-CM

## 2021-06-04 NOTE — Progress Notes (Signed)
? ?  PRENATAL VISIT NOTE ? ?Subjective:  ?Pamela Santos is a 31 y.o. G1P0 at [redacted]w[redacted]d being seen today for ongoing prenatal care.  She is currently monitored for the following issues for this low-risk pregnancy and has Tibia/fibula fracture, shaft, left, closed, initial encounter; Supervision of high risk pregnancy, antepartum; Late prenatal care; Language barrier; Rh negative state in antepartum period; and GBS (group B Streptococcus carrier), +RV culture, currently pregnant on their problem list. ? ?Patient reports fatigue.  Contractions: Irritability. Vag. Bleeding: None.  Movement: Present. Denies leaking of fluid.  ? ?The following portions of the patient's history were reviewed and updated as appropriate: allergies, current medications, past family history, past medical history, past social history, past surgical history and problem list.  ? ?Objective:  ? ?Vitals:  ? 06/04/21 0913  ?BP: 104/78  ?Pulse: 77  ?Weight: 144 lb 4.8 oz (65.5 kg)  ? ? ?Fetal Status: Fetal Heart Rate (bpm): 145   Movement: Present  Presentation: Vertex ? ?General:  Alert, oriented and cooperative. Patient is in no acute distress.  ?Skin: Skin is warm and dry. No rash noted.   ?Cardiovascular: Normal heart rate noted  ?Respiratory: Normal respiratory effort, no problems with respiration noted  ?Abdomen: Soft, gravid, appropriate for gestational age.  Pain/Pressure: Present     ?Pelvic: Cervical exam performed in the presence of a chaperone Dilation: 1 Effacement (%): 50 Station: -3  ?Extremities: Normal range of motion.  Edema: Trace  ?Mental Status: Normal mood and affect. Normal behavior. Normal judgment and thought content.  ? ?Assessment and Plan:  ?Pregnancy: G1P0 at [redacted]w[redacted]d ?1. Supervision of high risk pregnancy, antepartum ?- doing well ?- Follow-up US today  ? ?2. Language barrier ?- Interpreter present  ? ?3. Rh negative state in antepartum period ?- Rhogam given 03/25/21 ? ?4. GBS (group B Streptococcus carrier), +RV culture,  currently pregnant ?- Treat in labor, discussed with patient  ? ?5. Late prenatal care ? ?6. [redacted] weeks gestation of pregnancy ? ?Term labor symptoms and general obstetric precautions including but not limited to vaginal bleeding, contractions, leaking of fluid and fetal movement were reviewed in detail with the patient. ?Please refer to After Visit Summary for other counseling recommendations.  ? ?Return in about 1 week (around 06/11/2021) for LOB, In-Person. ? ?Future Appointments  ?Date Time Provider Department Center  ?06/11/2021  8:15 AM Reva Bores, MD [facility]  ?06/11/2021  9:15 AM [facility] NURSE [facility]  ?06/11/2021  9:30 AM [facility] US3 [facility]  ? ? ?Vonzella Nipple, PA-C ? ?

## 2021-06-10 ENCOUNTER — Other Ambulatory Visit: Payer: Self-pay

## 2021-06-10 ENCOUNTER — Encounter (HOSPITAL_COMMUNITY): Payer: Self-pay | Admitting: Anesthesiology

## 2021-06-10 ENCOUNTER — Encounter (HOSPITAL_COMMUNITY): Payer: Self-pay | Admitting: Obstetrics and Gynecology

## 2021-06-10 ENCOUNTER — Inpatient Hospital Stay (HOSPITAL_COMMUNITY)
Admission: AD | Admit: 2021-06-10 | Discharge: 2021-06-12 | DRG: 807 | Disposition: A | Discharge status: Home / Self Care | Payer: Medicaid Other | Attending: Obstetrics & Gynecology | Admitting: Obstetrics & Gynecology

## 2021-06-10 DIAGNOSIS — Z3A39 39 weeks gestation of pregnancy: Secondary | ICD-10-CM | POA: Diagnosis not present

## 2021-06-10 DIAGNOSIS — O4292 Full-term premature rupture of membranes, unspecified as to length of time between rupture and onset of labor: Secondary | ICD-10-CM | POA: Diagnosis not present

## 2021-06-10 DIAGNOSIS — O99824 Streptococcus B carrier state complicating childbirth: Secondary | ICD-10-CM | POA: Diagnosis present

## 2021-06-10 DIAGNOSIS — O26893 Other specified pregnancy related conditions, third trimester: Secondary | ICD-10-CM | POA: Diagnosis not present

## 2021-06-10 LAB — CBC
HCT: 39.5 % (ref 36.0–46.0)
Hemoglobin: 13.5 g/dL (ref 12.0–15.0)
MCH: 27.8 pg (ref 26.0–34.0)
MCHC: 34.2 g/dL (ref 30.0–36.0)
MCV: 81.4 fL (ref 80.0–100.0)
Platelets: 236 10*3/uL (ref 150–400)
RBC: 4.85 MIL/uL (ref 3.87–5.11)
RDW: 18.3 % — ABNORMAL HIGH (ref 11.5–15.5)
WBC: 9.1 10*3/uL (ref 4.0–10.5)
nRBC: 0 % (ref 0.0–0.2)

## 2021-06-10 LAB — TYPE AND SCREEN
ABO/RH(D): O NEG
Antibody Screen: POSITIVE

## 2021-06-10 MED ORDER — FERROUS SULFATE 325 (65 FE) MG PO TABS
325.0000 mg | ORAL_TABLET | ORAL | Status: DC
  Administered 2021-06-11: 325 mg via ORAL
  Filled 2021-06-10: qty 1

## 2021-06-10 MED ORDER — DIPHENHYDRAMINE HCL 50 MG/ML IJ SOLN: 12.5000 mg | INTRAMUSCULAR | Status: DC | PRN

## 2021-06-10 MED ORDER — BISACODYL 10 MG RE SUPP: 10.0000 mg | Freq: Every day | RECTAL | Status: DC | PRN

## 2021-06-10 MED ORDER — EPHEDRINE 5 MG/ML INJ: 10.0000 mg | INTRAVENOUS | Status: DC | PRN

## 2021-06-10 MED ORDER — FENTANYL CITRATE (PF) 100 MCG/2ML IJ SOLN
100.0000 ug | INTRAMUSCULAR | Status: DC | PRN
  Administered 2021-06-10: 100 ug via INTRAVENOUS

## 2021-06-10 MED ORDER — FLEET ENEMA 7-19 GM/118ML RE ENEM: 1.0000 | ENEMA | RECTAL | Status: DC | PRN

## 2021-06-10 MED ORDER — FENTANYL CITRATE (PF) 100 MCG/2ML IJ SOLN
INTRAMUSCULAR | Status: AC
  Filled 2021-06-10: qty 2

## 2021-06-10 MED ORDER — PHENYLEPHRINE 80 MCG/ML (10ML) SYRINGE FOR IV PUSH (FOR BLOOD PRESSURE SUPPORT): 80.0000 ug | PREFILLED_SYRINGE | INTRAVENOUS | Status: DC | PRN

## 2021-06-10 MED ORDER — MEDROXYPROGESTERONE ACETATE 150 MG/ML IM SUSP: 150.0000 mg | INTRAMUSCULAR | Status: DC | PRN

## 2021-06-10 MED ORDER — BENZOCAINE-MENTHOL 20-0.5 % EX AERO
1.0000 | INHALATION_SPRAY | CUTANEOUS | Status: DC | PRN
Start: 2021-06-10 — End: 2021-06-12

## 2021-06-10 MED ORDER — ACETAMINOPHEN 325 MG PO TABS: 650.0000 mg | ORAL_TABLET | ORAL | Status: DC | PRN

## 2021-06-10 MED ORDER — SODIUM CHLORIDE 0.9 % IV SOLN
5.0000 10*6.[IU] | Freq: Once | INTRAVENOUS | Status: AC
  Administered 2021-06-10: 5 10*6.[IU] via INTRAVENOUS
  Filled 2021-06-10: qty 5

## 2021-06-10 MED ORDER — COCONUT OIL OIL: 1.0000 "application " | TOPICAL_OIL | Status: DC | PRN

## 2021-06-10 MED ORDER — SODIUM CHLORIDE 0.9 % IV SOLN: 5.0000 10*6.[IU] | Freq: Once | INTRAVENOUS | Status: DC

## 2021-06-10 MED ORDER — IBUPROFEN 600 MG PO TABS
600.0000 mg | ORAL_TABLET | Freq: Four times a day (QID) | ORAL | Status: DC
  Administered 2021-06-10 – 2021-06-12 (×8): 600 mg via ORAL
  Filled 2021-06-10 (×8): qty 1

## 2021-06-10 MED ORDER — ONDANSETRON HCL 4 MG/2ML IJ SOLN: 4.0000 mg | INTRAMUSCULAR | Status: DC | PRN

## 2021-06-10 MED ORDER — SOD CITRATE-CITRIC ACID 500-334 MG/5ML PO SOLN: 30.0000 mL | ORAL | Status: DC | PRN

## 2021-06-10 MED ORDER — LACTATED RINGERS IV SOLN: 500.0000 mL | Freq: Once | INTRAVENOUS | Status: DC

## 2021-06-10 MED ORDER — DOCUSATE SODIUM 100 MG PO CAPS
100.0000 mg | ORAL_CAPSULE | Freq: Two times a day (BID) | ORAL | Status: DC
  Administered 2021-06-10 – 2021-06-12 (×3): 100 mg via ORAL
  Filled 2021-06-10 (×3): qty 1

## 2021-06-10 MED ORDER — OXYCODONE-ACETAMINOPHEN 5-325 MG PO TABS: 1.0000 | ORAL_TABLET | ORAL | Status: DC | PRN

## 2021-06-10 MED ORDER — OXYCODONE-ACETAMINOPHEN 5-325 MG PO TABS: 2.0000 | ORAL_TABLET | ORAL | Status: DC | PRN

## 2021-06-10 MED ORDER — METHYLERGONOVINE MALEATE 0.2 MG PO TABS: 0.2000 mg | ORAL_TABLET | ORAL | Status: DC | PRN

## 2021-06-10 MED ORDER — ONDANSETRON HCL 4 MG PO TABS
4.0000 mg | ORAL_TABLET | ORAL | Status: DC | PRN
Start: 2021-06-10 — End: 2021-06-12

## 2021-06-10 MED ORDER — PENICILLIN G POT IN DEXTROSE 60000 UNIT/ML IV SOLN: 3.0000 10*6.[IU] | INTRAVENOUS | Status: DC

## 2021-06-10 MED ORDER — FENTANYL-BUPIVACAINE-NACL 0.5-0.125-0.9 MG/250ML-% EP SOLN
12.0000 mL/h | EPIDURAL | Status: DC | PRN
  Filled 2021-06-10: qty 250

## 2021-06-10 MED ORDER — PRENATAL MULTIVITAMIN CH
1.0000 | ORAL_TABLET | Freq: Every day | ORAL | Status: DC
  Administered 2021-06-11 – 2021-06-12 (×2): 1 via ORAL
  Filled 2021-06-10 (×2): qty 1

## 2021-06-10 MED ORDER — OXYTOCIN BOLUS FROM INFUSION
333.0000 mL | Freq: Once | INTRAVENOUS | Status: AC
  Administered 2021-06-10: 333 mL via INTRAVENOUS

## 2021-06-10 MED ORDER — OXYTOCIN-SODIUM CHLORIDE 30-0.9 UT/500ML-% IV SOLN
2.5000 [IU]/h | INTRAVENOUS | Status: DC
  Filled 2021-06-10: qty 500

## 2021-06-10 MED ORDER — WITCH HAZEL-GLYCERIN EX PADS: 1.0000 "application " | MEDICATED_PAD | CUTANEOUS | Status: DC | PRN

## 2021-06-10 MED ORDER — TRANEXAMIC ACID-NACL 1000-0.7 MG/100ML-% IV SOLN
INTRAVENOUS | Status: AC
  Filled 2021-06-10: qty 100

## 2021-06-10 MED ORDER — LACTATED RINGERS IV SOLN: INTRAVENOUS | Status: DC

## 2021-06-10 MED ORDER — LACTATED RINGERS IV SOLN
INTRAVENOUS | Status: DC
Start: 2021-06-10 — End: 2021-06-10

## 2021-06-10 MED ORDER — LIDOCAINE HCL (PF) 1 % IJ SOLN: 30.0000 mL | INTRAMUSCULAR | Status: DC | PRN

## 2021-06-10 MED ORDER — LACTATED RINGERS IV SOLN: 500.0000 mL | INTRAVENOUS | Status: DC | PRN

## 2021-06-10 MED ORDER — ONDANSETRON HCL 4 MG/2ML IJ SOLN: 4.0000 mg | Freq: Four times a day (QID) | INTRAMUSCULAR | Status: DC | PRN

## 2021-06-10 MED ORDER — TETANUS-DIPHTH-ACELL PERTUSSIS 5-2.5-18.5 LF-MCG/0.5 IM SUSY: 0.5000 mL | PREFILLED_SYRINGE | Freq: Once | INTRAMUSCULAR | Status: DC

## 2021-06-10 MED ORDER — SIMETHICONE 80 MG PO CHEW: 80.0000 mg | CHEWABLE_TABLET | ORAL | Status: DC | PRN

## 2021-06-10 MED ORDER — DIBUCAINE (PERIANAL) 1 % EX OINT: 1.0000 "application " | TOPICAL_OINTMENT | CUTANEOUS | Status: DC | PRN

## 2021-06-10 MED ORDER — PENICILLIN G POT IN DEXTROSE 60000 UNIT/ML IV SOLN
3.0000 10*6.[IU] | INTRAVENOUS | Status: DC
  Administered 2021-06-10: 3 10*6.[IU] via INTRAVENOUS
  Filled 2021-06-10: qty 50

## 2021-06-10 MED ORDER — DIPHENHYDRAMINE HCL 25 MG PO CAPS: 25.0000 mg | ORAL_CAPSULE | Freq: Four times a day (QID) | ORAL | Status: DC | PRN

## 2021-06-10 MED ORDER — METHYLERGONOVINE MALEATE 0.2 MG/ML IJ SOLN: 0.2000 mg | INTRAMUSCULAR | Status: DC | PRN

## 2021-06-10 MED ORDER — FLEET ENEMA 7-19 GM/118ML RE ENEM: 1.0000 | ENEMA | Freq: Every day | RECTAL | Status: DC | PRN

## 2021-06-10 MED ORDER — MEASLES, MUMPS & RUBELLA VAC IJ SOLR: 0.5000 mL | Freq: Once | INTRAMUSCULAR | Status: DC

## 2021-06-10 NOTE — H&P (Signed)
? ?Pamela Santos is a 31 y.o. female G1P0 with IUP at 3341w4d by LMP and US presenting for contractions.  Has been in MAU under observation and has had cervical change.She reports positive fetal movement. She denies leakage of fluid or vaginal bleeding. ? ?Prenatal History/Complications: ?[facility] ?Pregnancy complications:  ?- Past Medical History: ?History reviewed. No pertinent past medical history. ? ?Past Surgical History: ?Past Surgical History:  ?Procedure Laterality Date  ? TIBIA IM NAIL INSERTION Left 12/16/2020  ? Procedure: INTRAMEDULLARY (IM) NAIL TIBIAL;  Surgeon: Roby LoftsHaddix, Kevin P, MD;  Location: [facility] OR;  Service: Orthopedics;  Laterality: Left;  ? ? ?Obstetrical History: ?OB History   ? ? Gravida  ?1  ? Para  ?   ? Term  ?   ? Preterm  ?   ? AB  ?   ? Living  ?   ?  ? ? SAB  ?   ? IAB  ?   ? Ectopic  ?   ? Multiple  ?   ? Live Births  ?   ?   ?  ?  ? ? ? ?Social History: ?Social History  ? ?Socioeconomic History  ? Marital status: Married  ?  Spouse name: Not on file  ? Number of children: Not on file  ? Years of education: Not on file  ? Highest education level: Not on file  ?Occupational History  ? Not on file  ?Tobacco Use  ? Smoking status: Never  ? Smokeless tobacco: Never  ?Vaping Use  ? Vaping Use: Never used  ?Substance and Sexual Activity  ? Alcohol use: Not Currently  ? Drug use: Never  ? Sexual activity: Not on file  ?Other Topics Concern  ? Not on file  ?Social History Narrative  ? Not on file  ? ?Social Determinants of Health  ? ?Financial Resource Strain: Not on file  ?Food Insecurity: Food Insecurity Present  ? Worried About Programme researcher, broadcasting/film/videounning Out of Food in the Last Year: Sometimes true  ? Ran Out of Food in the Last Year: Sometimes true  ?Transportation Needs: No Transportation Needs  ? Lack of Transportation (Medical): No  ? Lack of Transportation (Non-Medical): No  ?Physical Activity: Not on file  ?Stress: Not on file  ?Social Connections: Not on file  ? ? ?Family History: ?Family History   ?Problem Relation Age of Onset  ? Diabetes Neg Hx   ? Hypertension Neg Hx   ? Cancer Neg Hx   ? ? ?Allergies: ?No Known Allergies ? ?Medications Prior to Admission  ?Medication Sig Dispense Refill Last Dose  ? acetaminophen (TYLENOL) 500 MG tablet Take 1,000 mg by mouth every 6 (six) hours as needed (for pain.). (Patient not taking: Reported on 02/05/2021)     ? doxylamine, Sleep, (UNISOM) 25 MG tablet Take 1 tablet (25 mg total) by mouth at bedtime as needed for sleep (nausea and vomiting). 30 tablet 0   ? HYDROcodone-acetaminophen (NORCO/VICODIN) 5-325 MG tablet Take 1-2 tablets by mouth every 6 (six) hours as needed for severe pain (pain not controlled with Tylenol). (Patient not taking: Reported on 02/05/2021) 30 tablet 0   ? Prenatal Vit-Fe Fumarate-FA (MULTIVITAMIN-PRENATAL) 27-0.8 MG TABS tablet Take 1 tablet by mouth daily at 12 noon. (Patient not taking: Reported on 05/28/2021)     ? Prenatal Vit-Fe Fumarate-FA (PREPLUS) 27-1 MG TABS Take 1 tablet by mouth daily. 30 tablet 13   ? promethazine (PHENERGAN) 25 MG tablet Take 1 tablet (25 mg total) by  mouth every 6 (six) hours as needed for nausea or vomiting. (Patient not taking: Reported on 05/14/2021) 30 tablet 1   ? ? ?Review of Systems  ? ?Constitutional: Negative for fever and chills ?Eyes: Negative for visual disturbances ?Respiratory: Negative for shortness of breath, dyspnea ?Cardiovascular: Negative for chest pain or palpitations  ?Gastrointestinal: Negative for vomiting, diarrhea and constipation.  POSITIVE for abdominal pain (contractions) ?Genitourinary: Negative for dysuria and urgency ?Musculoskeletal: Negative for back pain, joint pain, myalgias  ?Neurological: Negative for dizziness and headaches ? ?Blood pressure 111/76, pulse 81, temperature (!) 97.5 ?F (36.4 ?C), temperature source Oral, resp. rate 18, last menstrual period 09/06/2020, SpO2 99 %. ?General appearance: alert, cooperative, and no distress ?Lungs: normal respiratory effort ?Heart:  regular rate and rhythm ?Abdomen: soft, non-tender; bowel sounds normal ?Extremities: Homans sign is negative, no sign of DVT ?DTR's 2+ ?Presentation: cephalic ?Fetal monitoring  Baseline: 145 bpm, Variability: Good {> 6 bpm), Accelerations: Reactive, and Decelerations: Absent ?Uterine activity  q 2-3 minutes ?Cx 6/100/-2 ? ? ?Prenatal labs: ?ABO, Rh: --/--/PENDING (05/11 1110)  O neg ?Antibody: PENDING (05/11 1110) ?Rubella: 9.57 (01/06 1005) ?RPR: Non Reactive (03/02 0855)  ?HBsAg: Negative (01/06 1005)  ?HIV: Non Reactive (03/02 0855)  ?GBS: Positive/-- (04/21 1123)  ? ? ? ?Nursing Staff Provider  ?Office Location [facility] Dating  LMP, confirm with ultrasound 02/05/21  ?[facility] Model Arly.Keller ] Traditional ?[ ]  Centering ?[ ]  Mom-Baby Dyad    ?Language   Anatomy  Normal  ?Flu Vaccine    Genetic/Carrier Screen  NIPS:    ?AFP:   Too late ?Horizon:  ?TDaP Vaccine  03/25/21 Hgb A1C or  ?GTT Early  ?Third trimester 71-110-78  ?COVID Vaccine 2 doses   LAB RESULTS   ?Rhogam  03/25/21 Blood Type O/Negative/-- (01/06 1005)   ?Baby Feeding Plan Breast Antibody Negative (01/06 1005)  ?Contraception Undecided Rubella 9.57 (01/06 1005)  ?Circumcision Yes RPR Non Reactive (01/06 1005)   ?Pediatrician  TAPM HBsAg Negative (01/06 1005)   ?Support Person Brother HCVAb Neg  ?Prenatal Classes NA HIV Non Reactive (01/06 1005)     ?BTL Consent NA GBS  Positive  ?VBAC Consent NA Pap  need PP  ?     ?DME Rx 06-14-1991 ] BP cuff ?[ ]  Weight Scale Waterbirth  [ ]  Class [ ]  Consent [ ]  CNM visit  ?PHQ9 & GAD7 06-14-1991 ] new OB ?[  ] 28 weeks  ?[  ] 36 weeks Induction  [ ]  Orders Entered [ ] Foley Y/N  ? ?Prenatal Transfer Tool  ?Maternal Diabetes: No ?Genetic Screening: Declined ?Maternal Ultrasounds/Referrals: Normal ?Fetal Ultrasounds or other Referrals:  None ?Maternal Substance Abuse:  No ?Significant Maternal Medications:  None ?Significant Maternal Lab Results: Group B Strep positive ? ?Results for orders placed or performed during the hospital encounter  of 06/10/21 (from the past 24 hour(s))  ?Type and screen [facility]  ? Collection Time: 06/10/21 11:10 AM  ?Result Value Ref Range  ? ABO/RH(D) PENDING   ? Antibody Screen PENDING   ? Sample Expiration    ?  06/13/2021,2359 ?Performed at [facility], [address] [place], 08/10/21 ?  ?CBC  ? Collection Time: 06/10/21 11:17 AM  ?Result Value Ref Range  ? WBC 9.1 4.0 - 10.5 K/uL  ? RBC 4.85 3.87 - 5.11 MIL/uL  ? Hemoglobin 13.5 12.0 - 15.0 g/dL  ? HCT 39.5 36.0 - 46.0 %  ? MCV 81.4 80.0 -  100.0 fL  ? MCH 27.8 26.0 - 34.0 pg  ? MCHC 34.2 30.0 - 36.0 g/dL  ? RDW 18.3 (H) 11.5 - 15.5 %  ? Platelets 236 150 - 400 K/uL  ? nRBC 0.0 0.0 - 0.2 %  ? ? ?Assessment: ?Pamela Santos is a 31 y.o. G1P0 with an IUP at [redacted]w[redacted]d presenting for labor ? ?Plan: ?#Labor: expectant management ?#Pain:  Per request ?#FWB Cat 1 ?#ID: GBS: PCN  ?#MOF:  breast ?#MOC: unsure ?#Circ: yes ? ? ?Pamela Santos ?06/10/2021, 1:54 PM ? ?

## 2021-06-10 NOTE — Progress Notes (Signed)
Using [place] interpreter, Orson Slick [phone] ?

## 2021-06-10 NOTE — MAU Note (Signed)
...  Pamela Santos is a 31 y.o. at [redacted]w[redacted]d here in MAU reporting: CTX since 0200 this morning that are currently every 5-7 minutes. Denies LOF or VB. +FM. ? ?GBS+. 1 cm on 06/04/2021. ? ?Pain score:  ?6/10 abdomen ? ?FHT: 151 initial external ?Lab orders placed from triage:  MAU Labor Eval ? ?

## 2021-06-10 NOTE — Lactation Note (Signed)
This note was copied from a baby's chart. ?Lactation Consultation Note ? ?Patient Name: Boy Terree Gaultney ?Today's Date: 06/10/2021 ?  ?Age:29 hours ? ?[place] Interpreter used [place] #[id number].  ? ?LC spoke with RN before entering the room. RN stated that she assisted mom with latching baby and baby latched easily. RN encouraged LC to enter the room and introduce myself to the patient. LC entered the room and spoke with mom using a [place] interpreter.  ? ?LC congratulated the parents and let the mom know that we were here to assist her with breastfeeding. LC asked let mom know that I was aware that baby had latched with the help of the RN and we would be able to assist her further when she went to [facility]. Mom thanked [facility] and LC exited the room.  ? ? ?LATCH Score ?Latch: Grasps breast easily, tongue down, lips flanged, rhythmical sucking. ? ?Audible Swallowing: Spontaneous and intermittent ? ?Type of Nipple: Everted at rest and after stimulation ? ?Comfort (Breast/Nipple): Soft / non-tender ? ?Hold (Positioning): Assistance needed to correctly position infant at breast and maintain latch. ? ?LATCH Score: 9 ? ? ?Lactation Tools Discussed/Used ?  ? ?Interventions ?  ? ?Discharge ?  ? ?Consult Status ?  ? ? ? ?Keerstin Bjelland P Saxon Crosby ?06/10/2021, 4:43 PM ? ? ? ?

## 2021-06-10 NOTE — Anesthesia Preprocedure Evaluation (Deleted)
Anesthesia Evaluation  ?Patient identified by MRN, date of birth, ID band ?Patient awake ? ? ? ?Reviewed: ?Allergy & Precautions, NPO status , Patient's Chart, lab work & pertinent test results ? ?Airway ?Mallampati: II ? ?TM Distance: >3 FB ?Neck ROM: Full ? ? ? Dental ? ?(+) Teeth Intact, Dental Advisory Given ?  ?Pulmonary ?neg pulmonary ROS,  ?  ?Pulmonary exam normal ?breath sounds clear to auscultation ? ? ? ? ? ? Cardiovascular ?negative cardio ROS ?Normal cardiovascular exam ?Rhythm:Regular Rate:Normal ? ? ?  ?Neuro/Psych ?negative neurological ROS ?   ? GI/Hepatic ?negative GI ROS, Neg liver ROS,   ?Endo/Other  ?negative endocrine ROS ? Renal/GU ?negative Renal ROS  ? ?  ?Musculoskeletal ?negative musculoskeletal ROS ?(+)  ? Abdominal ?  ?Peds ? Hematology ?negative hematology ROS ?(+) Plt 236k   ?Anesthesia Other Findings ?Day of surgery medications reviewed with the patient. ? Reproductive/Obstetrics ?(+) Pregnancy ? ?  ? ? ? ? ? ? ? ? ? ? ? ? ? ?  ?  ? ? ? ? ? ? ? ? ?Anesthesia Physical ?Anesthesia Plan ? ?ASA: 2 ? ?Anesthesia Plan: Epidural  ? ?Post-op Pain Management:   ? ?Induction:  ? ?PONV Risk Score and Plan: 2 and Treatment may vary due to age or medical condition ? ?Airway Management Planned: Natural Airway ? ?Additional Equipment:  ? ?Intra-op Plan:  ? ?Post-operative Plan:  ? ?Informed Consent: I have reviewed the patients History and Physical, chart, labs and discussed the procedure including the risks, benefits and alternatives for the proposed anesthesia with the patient or authorized representative who has indicated his/her understanding and acceptance.  ? ? ? ?Dental advisory given and Interpreter used for interveiw ? ?Plan Discussed with:  ? ?Anesthesia Plan Comments: (Patient identified. Risks/Benefits/Options discussed with patient including but not limited to bleeding, infection, nerve damage, paralysis, failed block, incomplete pain control, headache,  blood pressure changes, nausea, vomiting, reactions to medication both or allergic, itching and postpartum back pain. Confirmed with bedside nurse the patient's most recent platelet count. Confirmed with patient that they are not currently taking any anticoagulation, have any bleeding history or any family history of bleeding disorders. Patient expressed understanding and wished to proceed. All questions were answered. )  ? ? ? ? ? ? ?Anesthesia Quick Evaluation ? ?

## 2021-06-10 NOTE — Discharge Summary (Addendum)
? ?  Postpartum Discharge Summary ? ?Date of Service updated ? ?   ?Patient Name: Pamela Santos ?DOB: [birth year] ?MRN: [id number] ? ?Date of admission: 06/10/2021 ?Delivery date:06/10/2021  ?Delivering provider: Christin Fudge  ?Date of discharge: 06/12/2021 ? ?Admitting diagnosis: Normal labor and delivery [O80] ?Intrauterine pregnancy: [redacted]w[redacted]d     ?Secondary diagnosis:  Principal Problem: ?  Normal labor and delivery ? ?Additional problems: GBS +    ?Discharge diagnosis: Term Pregnancy Delivered                                              ?Post partum procedures: None ?Augmentation: none ?Complications: None ? ?Hospital course: Onset of Labor With Vaginal Delivery      ?31 y.o. yo G1P0 at [redacted]w[redacted]d was admitted in Active Labor on 06/10/2021. Patient had an uncomplicated labor course as follows:  ?Membrane Rupture Time/Date: 1:27 PM ,06/10/2021   ?Delivery Method:Vaginal, Spontaneous  ?Episiotomy:   ?Lacerations:  Vaginal  ?Patient had an uncomplicated postpartum course.  She is ambulating, tolerating a regular diet, passing flatus, and urinating well. Patient is discharged home in stable condition on 06/12/21. ? ?Newborn Data: ?Birth date:06/10/2021  ?Birth time:3:41 PM  ?Gender:Female  ?Living status:Living  ?Apgars:9 ,9  ?Weight:2940 g  ? ?Magnesium Sulfate received: No ?BMZ received: No ?Rhophylac:Yes ?MMR:N/A ?T-DaP:Given prenatally ?Flu: N/A ?Transfusion:No ? ?Physical exam  ?Vitals:  ? 06/10/21 2315 06/11/21 0540 06/11/21 1955 06/12/21 0548  ?BP: 100/62 105/75 110/78 103/77  ?Pulse: 87 84  79  ?Resp: $Remov'16 16 18 18  'NsNylJ$ ?Temp: 97.7 ?F (36.5 ?C) 98 ?F (36.7 ?C) 97.9 ?F (36.6 ?C) 97.6 ?F (36.4 ?C)  ?TempSrc: Oral Oral Oral Oral  ?SpO2: 100% 100% 100% 100%  ? ?General: alert, cooperative, and no distress ?Lochia: appropriate ?Uterine Fundus: firm ?Incision: N/A ?DVT Evaluation: No evidence of DVT seen on physical exam. ?Labs: ?Lab Results  ?Component Value Date  ? WBC 13.5 (H) 06/11/2021  ? HGB 10.9 (L) 06/11/2021  ?  HCT 33.5 (L) 06/11/2021  ? MCV 82.3 06/11/2021  ? PLT 214 06/11/2021  ? ?   ? View : No data to display.  ?  ?  ?  ? ?Edinburgh Score: ?   ? View : No data to display.  ?  ?  ?  ? ? ? ?After visit meds:  ?Allergies as of 06/12/2021   ?No Known Allergies ?  ? ?  ?Medication List  ?  ? ?STOP taking these medications   ? ?doxylamine (Sleep) 25 MG tablet ?Commonly known as: UNISOM ?  ?HYDROcodone-acetaminophen 5-325 MG tablet ?Commonly known as: NORCO/VICODIN ?  ?multivitamin-prenatal 27-0.8 MG Tabs tablet ?  ?PrePLUS 27-1 MG Tabs ?  ?promethazine 25 MG tablet ?Commonly known as: PHENERGAN ?  ? ?  ? ?TAKE these medications   ? ?acetaminophen 325 MG tablet ?Commonly known as: Tylenol ?Take 2 tablets (650 mg total) by mouth every 4 (four) hours as needed (for pain scale < 4). ?What changed:  ?medication strength ?how much to take ?when to take this ?reasons to take this ?  ?ibuprofen 600 MG tablet ?Commonly known as: ADVIL ?Take 1 tablet (600 mg total) by mouth every 6 (six) hours. ?  ?melatonin 1 MG Tabs tablet ?Take 1 tablet (1 mg total) by mouth at bedtime. ?  ? ?  ? ? ? ?Discharge home in stable condition ?Infant Feeding:  Breast ?Infant Disposition:home with mother ?Discharge instruction: per After Visit Summary and Postpartum booklet. ?Activity: Advance as tolerated. Pelvic rest for 6 weeks.  ?Diet: routine diet ?Future Appointments: ?Future Appointments  ?Date Time Provider [place]  ?07/21/2021 10:55 AM Gabriel Carina, CNM [facility]  ? ?Follow up Visit: ? ? ?Please schedule this patient for a In person postpartum visit in 4 weeks with the following provider: Any provider. ?Additional Postpartum F/U:  ?Low risk pregnancy complicated by:  None ?Delivery mode:  Vaginal, Spontaneous  ?Anticipated Birth Control:  declines ? ? ?06/12/2021 ?Alen Bleacher, MD ? ? ? ?

## 2021-06-11 ENCOUNTER — Encounter: Payer: 59 | Admitting: Family Medicine

## 2021-06-11 ENCOUNTER — Ambulatory Visit: Payer: No Typology Code available for payment source

## 2021-06-11 ENCOUNTER — Ambulatory Visit: Payer: 59

## 2021-06-11 LAB — RPR: RPR Ser Ql: NONREACTIVE

## 2021-06-11 LAB — CBC
HCT: 33.5 % — ABNORMAL LOW (ref 36.0–46.0)
Hemoglobin: 10.9 g/dL — ABNORMAL LOW (ref 12.0–15.0)
MCH: 26.8 pg (ref 26.0–34.0)
MCHC: 32.5 g/dL (ref 30.0–36.0)
MCV: 82.3 fL (ref 80.0–100.0)
Platelets: 214 10*3/uL (ref 150–400)
RBC: 4.07 MIL/uL (ref 3.87–5.11)
RDW: 18.4 % — ABNORMAL HIGH (ref 11.5–15.5)
WBC: 13.5 10*3/uL — ABNORMAL HIGH (ref 4.0–10.5)
nRBC: 0 % (ref 0.0–0.2)

## 2021-06-11 MED ORDER — RHO D IMMUNE GLOBULIN 1500 UNIT/2ML IJ SOSY
300.0000 ug | PREFILLED_SYRINGE | Freq: Once | INTRAMUSCULAR | Status: AC
  Administered 2021-06-11: 300 ug via INTRAVENOUS
  Filled 2021-06-11: qty 2

## 2021-06-11 NOTE — Social Work (Signed)
CSW received consult for "Parents expressing they need a crib and/or other infant supplies." CSW also noted "food insecurities."  CSW met with MOB to offer support and complete assessment.   ? ?CSW met with MOB at bedside to provide a pack n play and found there was one already delivered at bedside with fitted sheets. There was a car seat present.  ? ?CSW used the interpreter [place]# [phone]. CSW provided review of Sudden Infant Death Syndrome (SIDS) precautions. MOB reported understanding. CSW inquired if there were any other essential items that MOB needed for the infant. MOB reported that she could use additional diapers. MOB made aware that [facility] will assist as she had signed up for the program earlier in the day. MOB reported understanding. CSW inquired about food insecurities. MOB expressed no concerns with food. MOB reported she will breastfeed, and bottle feed the infant. MOB reported she applied for [facility] and told to call back when she had the baby. MOB reported she plans to follow up. CSW informed MOB about Food Stamps and explained their services. CSW educated MOB on how to follow up. CSW also provided MOB with a comprehensive list of Resources in [facility] including food assistance. MOB was appreciative.  ? ?CSW assessed MOB for additional need. MOB reported no further need.  ? ?CSW identifies no further need for intervention and no barriers to discharge at this time. ? ?Kathrin Greathouse, MSW, LCSW ?[facility] [place]  ?Clinical Social Worker  ?[phone] ?May 25, 2021  3:46 PM  ?

## 2021-06-11 NOTE — Progress Notes (Signed)
PostPartum Day 1 ?Subjective: ?no complaints, up ad lib, voiding, tolerating PO, and + flatus ? ?Objective: ?Blood pressure 110/78, pulse 84, temperature 97.9 ?F (36.6 ?C), temperature source Oral, resp. rate 18, last menstrual period 09/06/2020, SpO2 100 %, unknown if currently breastfeeding. ? ?Physical Exam:  ?General: alert, cooperative, appears stated age, and no distress ?Lochia: appropriate ?Uterine Fundus: firm ?Incision: N/A ?DVT Evaluation: No evidence of DVT seen on physical exam. ? ?Recent Labs  ?  06/10/21 ?1117 06/11/21 ?[id number]  ?HGB 13.5 10.9*  ?HCT 39.5 33.5*  ? ? ?Assessment/Plan: ?Plan for discharge tomorrow and Breastfeeding, Circ OP ? ? LOS: 1 day  ? ?Gaylan Gerold, CNM, MSN, IBCLC ?Certified Nurse Midwife, [place] ? ?  ? ? ?

## 2021-06-11 NOTE — Lactation Note (Addendum)
This note was copied from a baby's chart. ?Lactation Consultation Note ? ?Patient Name: Pamela Santos ?Today's Date: 06/11/2021 ?Reason for consult: Initial assessment;1st time breastfeeding;Primapara;Term ?Age:29 hours ? ?LC in to visit with P1 Mom of term baby.  Baby is at 6.2% weight increase with 3 stools and 2 voids so far.  Messaged RN about a possible re-weigh of baby.  ? ?Baby asleep swaddled in crib.  Mom reports baby is sleepy.  Baby latched after delivery (latch score of 9) and has had 6 short breastfeedings today.  Mom reports positive breast changes with pregnancy.   ? ?Using [place] [employer] interpreter, lots of teaching done.  ?Reviewed breast massage and hand expression, colostrum drops expressed but Mom is sensitive to hand expression and winced.  Encouraged Mom to do this herself, assisted her to use correct technique and a small drop identified. ? ?Unwrapped baby and placed him STS in football hold.  Baby opened his mouth but turned away from breast and was too sleepy and not interested in breastfeeding.  Reassured parents that this is normal newborn behavior.  Placed baby STS prone on Mom's chest and encouraged Mom to keep baby there until he is actively latching and is consistent.   ? ?Explained to Mom that offering supplement if baby is too sleepy to feed after 20-24 hrs of age.  Until then baby should remain STS.  Talked about pumping if baby unable to latch. ? ?Mom knows to call for her nurse with baby starts showing interest.  RN will call LC prn. ? ?Mom aware of IP and OP lactation support available to her. ? ?Maternal Data ?Has patient been taught Hand Expression?: Yes ?Does the patient have breastfeeding experience prior to this delivery?: No ? ?Feeding ?Mother's Current Feeding Choice: Breast Milk ? ?LATCH Score ?Latch: Too sleepy or reluctant, no latch achieved, no sucking elicited. ? ?Audible Swallowing: None ? ?Type of Nipple: Everted at rest and after stimulation ? ?Comfort  (Breast/Nipple): Soft / non-tender ? ?Hold (Positioning): Assistance needed to correctly position infant at breast and maintain latch. ? ?LATCH Score: 5 ? ?Interventions ?Interventions: Breast feeding basics reviewed;Assisted with latch;Skin to skin;Breast massage;Hand express;Breast compression;Adjust position;Support pillows;Position options;Education;LC Services brochure ? ?Discharge ?WIC Program: Yes ? ?Consult Status ?Consult Status: Follow-up ?Date: 06/12/21 ?Follow-up type: In-patient ? ? ? ?Johny Blamer E ?06/11/2021, 9:15 AM ? ? ? ?

## 2021-06-12 LAB — RH IG WORKUP (INCLUDES ABO/RH)
Fetal Screen: NEGATIVE
Gestational Age(Wks): 39
Unit division: 0

## 2021-06-12 MED ORDER — ACETAMINOPHEN 325 MG PO TABS: 650.0000 mg | ORAL_TABLET | ORAL | 0 refills | Status: DC | PRN

## 2021-06-12 MED ORDER — MELATONIN 1 MG PO TABS: 1.0000 mg | ORAL_TABLET | Freq: Every day | ORAL | 0 refills | Status: DC

## 2021-06-12 MED ORDER — IBUPROFEN 600 MG PO TABS: 600.0000 mg | ORAL_TABLET | Freq: Four times a day (QID) | ORAL | 0 refills | Status: DC

## 2021-06-12 NOTE — Lactation Note (Addendum)
This note was copied from a baby's chart. ?Lactation Consultation Note ? ?Patient Name: Boy Zoa Dowty ?Today's Date: 06/12/2021 ?Reason for consult: Follow-up assessment ?Age:29 hours ? ?Audio interpreter (female) was used for this consult.  ? ?Mom's milk is coming to volume. Mom has initial tenderness with latch, but it dissipates as feeding progresses. Infant slept though consult, as he had fed recently.  ? ?I provided Mom with a hand pump & showed her how to use it, disassemble it for washing, etc.  ? ?Now that her milk is coming in, I asked Mom to monitor for softening of the breast as the baby feeds. I told her that if she does not notice softening of the breast, then she would need to pump and give EBM to infant in a bottle. However, I am confident that Mom will likely detect softening of the breast with feedings.  ? ?Mother's questions answered to her satisfaction. She knows how to reach [place] for any post-d/c questions. Parents were pleased with consult.  ? ? ?Lactation Tools Discussed/Used ?Tools: Pump;Flanges ?Flange Size: 24 ?Breast pump type: Manual ? ? ?Discharge ?Pump: Manual ? ? ? ?Lurline Hare [place] ?06/12/2021, 2:04 PM ? ? ? ?

## 2021-06-12 NOTE — Lactation Note (Signed)
This note was copied from a baby's chart. ?Lactation Consultation Note ? ?Patient Name: Pamela Santos ?Today's Date: 06/12/2021 ?  ?Age:29 hours ? ?Neither a video interpreter nor an audio interpreter was available. LC to attempt a consult at a later time.   ?Lurline Hare [place] ?06/12/2021, 11:37 AM ? ? ? ?

## 2021-06-21 ENCOUNTER — Telehealth (HOSPITAL_COMMUNITY): Payer: Self-pay | Admitting: *Deleted

## 2021-06-21 NOTE — Telephone Encounter (Signed)
Left phone voicemail message.  Odis Hollingshead, RN 06-21-2021 at 9:12am

## 2021-07-02 ENCOUNTER — Other Ambulatory Visit: Payer: Self-pay

## 2021-07-21 ENCOUNTER — Ambulatory Visit: Payer: Medicaid Other | Admitting: Certified Nurse Midwife

## 2021-08-10 ENCOUNTER — Other Ambulatory Visit: Payer: Self-pay

## 2021-08-10 ENCOUNTER — Encounter: Payer: Self-pay | Admitting: Obstetrics and Gynecology

## 2021-08-10 ENCOUNTER — Other Ambulatory Visit (HOSPITAL_COMMUNITY)
Admission: RE | Admit: 2021-08-10 | Discharge: 2021-08-10 | Disposition: A | Discharge status: Home / Self Care | Payer: Medicaid Other | Source: Ambulatory Visit | Attending: Certified Nurse Midwife | Admitting: Certified Nurse Midwife

## 2021-08-10 ENCOUNTER — Ambulatory Visit (INDEPENDENT_AMBULATORY_CARE_PROVIDER_SITE_OTHER): Payer: Medicaid Other | Admitting: Obstetrics and Gynecology

## 2021-08-10 VITALS — BP 107/79 | HR 81 | Wt 151.3 lb

## 2021-08-10 DIAGNOSIS — Z789 Other specified health status: Secondary | ICD-10-CM

## 2021-08-10 DIAGNOSIS — Z124 Encounter for screening for malignant neoplasm of cervix: Secondary | ICD-10-CM | POA: Insufficient documentation

## 2021-08-10 MED ORDER — METAMUCIL SMOOTH TEXTURE 58.6 % PO POWD: 1.0000 | Freq: Two times a day (BID) | ORAL | 12 refills | Status: DC

## 2021-08-10 NOTE — Progress Notes (Signed)
    Post Partum Visit Note  Pamela Santos is a 31 y.o. G1P1001 s/p 5/11 SVD/1st degree (not repaired) at [redacted]w[redacted]d after presenting in labor  Anesthesia: none. Postpartum course has been uncomplicated. Baby is doing well. Baby is feeding by breast. Bleeding staining only. Bowel function is normal. Bladder function is normal. Patient is not sexually active. Contraception method is none. Postpartum depression screening: negative.  Patient states she has pain with BMs, +constipation and notes the hemorrhoids   Edinburgh Postnatal Depression Scale - 08/10/21 1118       Edinburgh Postnatal Depression Scale:  In the Past 7 Days   I have been able to laugh and see the funny side of things. 0    I have looked forward with enjoyment to things. 0    I have blamed myself unnecessarily when things went wrong. 0    I have been anxious or worried for no good reason. 0    I have felt scared or panicky for no good reason. 0    Things have been getting on top of me. 0    I have been so unhappy that I have had difficulty sleeping. 0    I have felt sad or miserable. 0    I have been so unhappy that I have been crying. 0    The thought of harming myself has occurred to me. 0    Edinburgh Postnatal Depression Scale Total 0             Review of Systems Pertinent items noted in HPI and remainder of comprehensive ROS otherwise negative.  Objective:  BP 107/79   Pulse 81   Wt 151 lb 4.8 oz (68.6 kg)   Breastfeeding Yes   BMI 24.42 kg/m    NAD Abdomen: nttp EGBUS, vaginal vault and cervix wnl. Pt very sensitive on exam, which I believe she was like at her GBS exam Two small external <40mm sized hemorrhoids, nttp, non thrombosed Assessment:    Normal postpartum exam.   Plan:  Routine PP care. Pap done today. Pt declines birth control today. D/w her re: lactational amenorrhea. I d/w her that hemorrhoids are normal and don't appear to be causing the issue and recommend bid fiber to help with the  constipation. I d/w her that vaginal estrogen may help with her pelvic discomfort but she declines this. F/u for annual in 2-3 months and if still sensitive then may benefit with pelvic pt.   Interpreter used  Ziebach Bing, MD [facility] for [employer], [facility]

## 2021-08-13 LAB — CYTOLOGY - PAP: Diagnosis: NEGATIVE

## 2021-08-18 ENCOUNTER — Telehealth: Payer: Self-pay | Admitting: Lactation Services

## 2021-08-18 NOTE — Telephone Encounter (Signed)
Called patient with assistance of [facility] Telephone [employer], # [id number].   Patient did not answer her home number listed. Message left for patient to call the office at [phone] for non urgent results.   Called cell number listed in chart, patient answered. Patient informed of normal Pap Smear and needs for repeat Pap Smear in 3 years. Patient voiced understanding.

## 2021-08-18 NOTE — Telephone Encounter (Signed)
-----   Message from [place] Bing, MD sent at 08/17/2021  9:30 PM EDT ----- All normal. Please let her know that she will need another pap in 3 years

## 2021-10-25 ENCOUNTER — Other Ambulatory Visit: Payer: Self-pay

## 2021-10-25 ENCOUNTER — Encounter: Payer: Self-pay | Admitting: Obstetrics & Gynecology

## 2021-10-25 ENCOUNTER — Ambulatory Visit (INDEPENDENT_AMBULATORY_CARE_PROVIDER_SITE_OTHER): Payer: Medicaid Other | Admitting: Obstetrics & Gynecology

## 2021-10-25 VITALS — BP 111/70 | HR 98 | Wt 139.6 lb

## 2021-10-25 DIAGNOSIS — Z01419 Encounter for gynecological examination (general) (routine) without abnormal findings: Secondary | ICD-10-CM

## 2021-10-25 NOTE — Progress Notes (Signed)
GYNECOLOGY ANNUAL PREVENTATIVE CARE ENCOUNTER NOTE  History:     Pamela Santos is a 31 y.o. G50P1001 female here for a routine annual gynecologic exam. No complaints, only concern is amenorrhea 4 months post partum. Patient is breast feeding and formula feeding. Child is doing well. 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 Denies abnormal vaginal bleeding, discharge, pelvic pain, problems with intercourse or other gynecologic concerns.    Gynecologic History No LMP recorded. Contraception: none Last Pap: 08/10/2021. Result was normal with negative HPV  Obstetric History OB History  Gravida Para Term Preterm AB Living  1 1 1     1   SAB IAB Ectopic Multiple Live Births        0 1    # Outcome Date GA Lbr Len/2nd Weight Sex Delivery Anes PTL Lv  1 Term 06/10/21 [redacted]w[redacted]d / 00:45 6 lb 7.7 oz (2.94 kg) M Vag-Spont None  LIV    Past Medical History:  Diagnosis Date   Rh negative state in antepartum period 03/06/2021   Tibia/fibula fracture, shaft, left, closed, initial encounter 12/15/2020   Added automatically from request for surgery 12/17/2020    Past Surgical History:  Procedure Laterality Date   TIBIA IM NAIL INSERTION Left 12/16/2020   Procedure: INTRAMEDULLARY (IM) NAIL TIBIAL;  Surgeon: 12/18/2020, MD;  Location: [facility] OR;  Service: Orthopedics;  Laterality: Left;    Current Outpatient Medications on File Prior to Visit  Medication Sig Dispense Refill   acetaminophen (TYLENOL) 325 MG tablet Take 2 tablets (650 mg total) by mouth every 4 (four) hours as needed (for pain scale < 4). 50 tablet 0   ibuprofen (ADVIL) 600 MG tablet Take 1 tablet (600 mg total) by mouth every 6 (six) hours. 30 tablet 0   melatonin 1 MG TABS tablet Take 1 tablet (1 mg total) by mouth at bedtime. 30 tablet 0   psyllium (METAMUCIL SMOOTH TEXTURE) 58.6 % powder Take 1 packet by mouth 2 (two) times daily. 283 g 12   No current facility-administered medications on file prior to visit.    No Known Allergies  Social  History:  reports that she has never smoked. She has never used smokeless tobacco. She reports that she does not currently use alcohol. She reports that she does not use drugs.  Family History  Problem Relation Age of Onset   Diabetes Neg Hx    Hypertension Neg Hx    Cancer Neg Hx     The following portions of the patient's history were reviewed and updated as appropriate: allergies, current medications, past family history, past medical history, past social history, past surgical history and problem list.  Review of Systems Pertinent items noted in HPI and remainder of comprehensive ROS otherwise negative.  Physical Exam:  BP 111/70   Pulse 98   Wt 139 lb 9.6 oz (63.3 kg)   Breastfeeding Yes   BMI 22.53 kg/m  CONSTITUTIONAL: Well-developed, well-nourished female in no acute distress.  HENT:  Normocephalic, atraumatic, External right and left ear normal.  EYES: Conjunctivae and EOM are normal. Pupils are equal, round, and reactive to light. No scleral icterus.  NECK: Normal range of motion, supple, no masses.  Normal thyroid.  SKIN: Skin is warm and dry. No rash noted. Not diaphoretic. No erythema. No pallor. MUSCULOSKELETAL: Normal range of motion. No tenderness.  No cyanosis, clubbing, or edema. NEUROLOGIC: Alert and oriented to person, place, and time. Normal reflexes, muscle tone coordination.  PSYCHIATRIC: Normal mood and affect. Normal behavior.  Normal judgment and thought content. CARDIOVASCULAR: Normal heart rate noted, regular rhythm RESPIRATORY: Clear to auscultation bilaterally. Effort and breath sounds normal, no problems with respiration noted. BREASTS: Symmetric in size. No masses, tenderness, skin changes, nipple drainage, or lymphadenopathy bilaterally. Performed in the presence of a chaperone. ABDOMEN: Soft, no distention noted.  No tenderness, rebound or guarding.  PELVIC: Deferred.   Assessment and Plan:  Patient understands lack of menorrhea after pregnancy is  likely due to breast feeding.  Patient denied any birth control methods and wishes to continue having pregnancies.  Next pap smear will be in 3 years. Routine preventative health maintenance measures emphasized. Please refer to After Visit Summary for other counseling recommendations.   Glorious Peach, Medical Student  10/25/2021, 3:37 pm

## 2021-10-25 NOTE — Progress Notes (Signed)
Patient is here for annual exam, seen by medical student and myself. Refer to separate note.  Verita Schneiders, MD

## 2021-10-25 NOTE — Progress Notes (Deleted)
Patient is here for annual exam

## 2021-12-08 ENCOUNTER — Other Ambulatory Visit: Payer: Self-pay | Admitting: Student

## 2022-10-20 ENCOUNTER — Encounter: Payer: Self-pay | Admitting: Obstetrics & Gynecology

## 2022-10-20 ENCOUNTER — Ambulatory Visit (INDEPENDENT_AMBULATORY_CARE_PROVIDER_SITE_OTHER): Payer: Medicaid Other | Admitting: Obstetrics & Gynecology

## 2022-10-20 ENCOUNTER — Other Ambulatory Visit (HOSPITAL_COMMUNITY)
Admission: RE | Admit: 2022-10-20 | Discharge: 2022-10-20 | Disposition: A | Discharge status: Home / Self Care | Payer: Medicaid Other | Source: Ambulatory Visit | Attending: Obstetrics & Gynecology | Admitting: Obstetrics & Gynecology

## 2022-10-20 ENCOUNTER — Other Ambulatory Visit: Payer: Self-pay

## 2022-10-20 VITALS — BP 113/74 | HR 77 | Ht 63.5 in | Wt 151.2 lb

## 2022-10-20 DIAGNOSIS — N926 Irregular menstruation, unspecified: Secondary | ICD-10-CM | POA: Insufficient documentation

## 2022-10-20 DIAGNOSIS — N760 Acute vaginitis: Secondary | ICD-10-CM | POA: Diagnosis not present

## 2022-10-20 NOTE — Patient Instructions (Signed)
Hydrocortisone cream can be applied for itching

## 2022-10-20 NOTE — Progress Notes (Signed)
GYNECOLOGY OFFICE VISIT NOTE  History:   Pamela Santos is a 32 y.o. G1P1001 here today for evaluation of intermittent vaginal irritation for the past two months and reported irregular menstrual bleeding.  Patient is French-speaking only, interpreter present for this encounter. Reports itching off and on for the past few months, no abnormal discharge.  No intervention.  As for her periods, reports having spotting some months and normal bleeding other months.  Also missed one month in the last 3-4 months.  Not sexually active for several months.   She denies any abnormal vaginal discharge, pelvic pain or other concerns.    Past Medical History:  Diagnosis Date   Rh negative state in antepartum period 03/06/2021   Tibia/fibula fracture, shaft, left, closed, initial encounter 12/15/2020   Added automatically from request for surgery [id number]    Past Surgical History:  Procedure Laterality Date   TIBIA IM NAIL INSERTION Left 12/16/2020   Procedure: INTRAMEDULLARY (IM) NAIL TIBIAL;  Surgeon: Roby Lofts, MD;  Location: [facility] OR;  Service: Orthopedics;  Laterality: Left;    The following portions of the patient's history were reviewed and updated as appropriate: allergies, current medications, past family history, past medical history, past social history, past surgical history and problem list.   Health Maintenance:  Normal pap on 08/10/2021.   Review of Systems:  Pertinent items noted in HPI and remainder of comprehensive ROS otherwise negative.  Physical Exam:  BP 113/74   Pulse 77   Ht 5' 3.5" (1.613 m)   Wt 151 lb 3.2 oz (68.6 kg)   LMP 10/13/2022 (Exact Date)   Breastfeeding No   BMI 26.36 kg/m  CONSTITUTIONAL: Well-developed, well-nourished female in no acute distress.  HEENT:  Normocephalic, atraumatic. External right and left ear normal. No scleral icterus.  NECK: Normal range of motion, supple, no masses noted on observation SKIN: No rash noted. Not diaphoretic. No erythema.  No pallor. MUSCULOSKELETAL: Normal range of motion. No edema noted. NEUROLOGIC: Alert and oriented to person, place, and time. Normal muscle tone coordination. No cranial nerve deficit noted. PSYCHIATRIC: Normal mood and affect. Normal behavior. Normal judgment and thought content. CARDIOVASCULAR: Normal heart rate noted RESPIRATORY: Effort and breath sounds normal, no problems with respiration noted ABDOMEN: No masses noted. No other overt distention noted.   PELVIC: Normal appearing external genitalia; normal urethral meatus; normal appearing distal vaginal mucosa.  Scant clear discharge noted, testing sample obtained.  Performed in the presence of a chaperone     Assessment and Plan:    1. Vulvovaginitis Unsure etiology, will follow up results. Proper vulvar hygiene emphasized: discussed avoidance of perfumed soaps, detergents, lotions and any type of douches; in addition to wearing cotton underwear and no underwear at night.  Also recommended cleaning front to back, voiding and cleaning up after intercourse.  - Cervicovaginal ancillary only  2. Irregular menstrual bleeding Emphasized stress, illness and other etiologies can cause occasional missed period.  Not worried much about variation in flow, but will do lab evaluation.  Will follow up results and manage accordingly. - TSH Rfx on Abnormal to Free T4 - Testosterone,Free and Total - Beta hCG quant (ref lab) - CBC - Cervicovaginal ancillary only  Routine preventative health maintenance measures emphasized. Please refer to After Visit Summary for other counseling recommendations.   Return for any gynecologic concerns.    I spent 30 minutes dedicated to the care of this patient including pre-visit review of records, face to face time with the patient discussing  her conditions and treatments and post visit orders.    Jaynie Collins, MD, FACOG Obstetrician & Gynecologist, [facility] for [employer], [facility]

## 2022-10-21 LAB — CERVICOVAGINAL ANCILLARY ONLY
Bacterial Vaginitis (gardnerella): NEGATIVE
Candida Glabrata: NEGATIVE
Candida Vaginitis: NEGATIVE
Chlamydia: NEGATIVE
Comment: NEGATIVE
Comment: NEGATIVE
Comment: NEGATIVE
Comment: NEGATIVE
Comment: NEGATIVE
Comment: NORMAL
Neisseria Gonorrhea: NEGATIVE
Trichomonas: NEGATIVE

## 2022-10-23 LAB — CBC
Hematocrit: 39.8 % (ref 34.0–46.6)
Hemoglobin: 13.4 g/dL (ref 11.1–15.9)
MCH: 29.6 pg (ref 26.6–33.0)
MCHC: 33.7 g/dL (ref 31.5–35.7)
MCV: 88 fL (ref 79–97)
Platelets: 308 10*3/uL (ref 150–450)
RBC: 4.53 x10E6/uL (ref 3.77–5.28)
RDW: 13.1 % (ref 11.7–15.4)
WBC: 5.2 10*3/uL (ref 3.4–10.8)

## 2022-10-23 LAB — TESTOSTERONE,FREE AND TOTAL
Testosterone, Free: 1 pg/mL (ref 0.0–4.2)
Testosterone: 35 ng/dL (ref 13–71)

## 2022-10-23 LAB — TSH RFX ON ABNORMAL TO FREE T4: TSH: 1.3 u[IU]/mL (ref 0.450–4.500)

## 2022-10-23 LAB — BETA HCG QUANT (REF LAB): hCG Quant: <1 m[IU]/mL

## 2023-02-09 DIAGNOSIS — H5213 Myopia, bilateral: Secondary | ICD-10-CM | POA: Diagnosis not present

## 2023-04-14 IMAGING — DX DG ANKLE COMPLETE 3+V*L*
3 series · 3 of 3 positions shown · non-contrast
Comparison: None.

CLINICAL DATA: Fracture

EXAM:
LEFT ANKLE COMPLETE - 3 VIEW

[ankle ap]
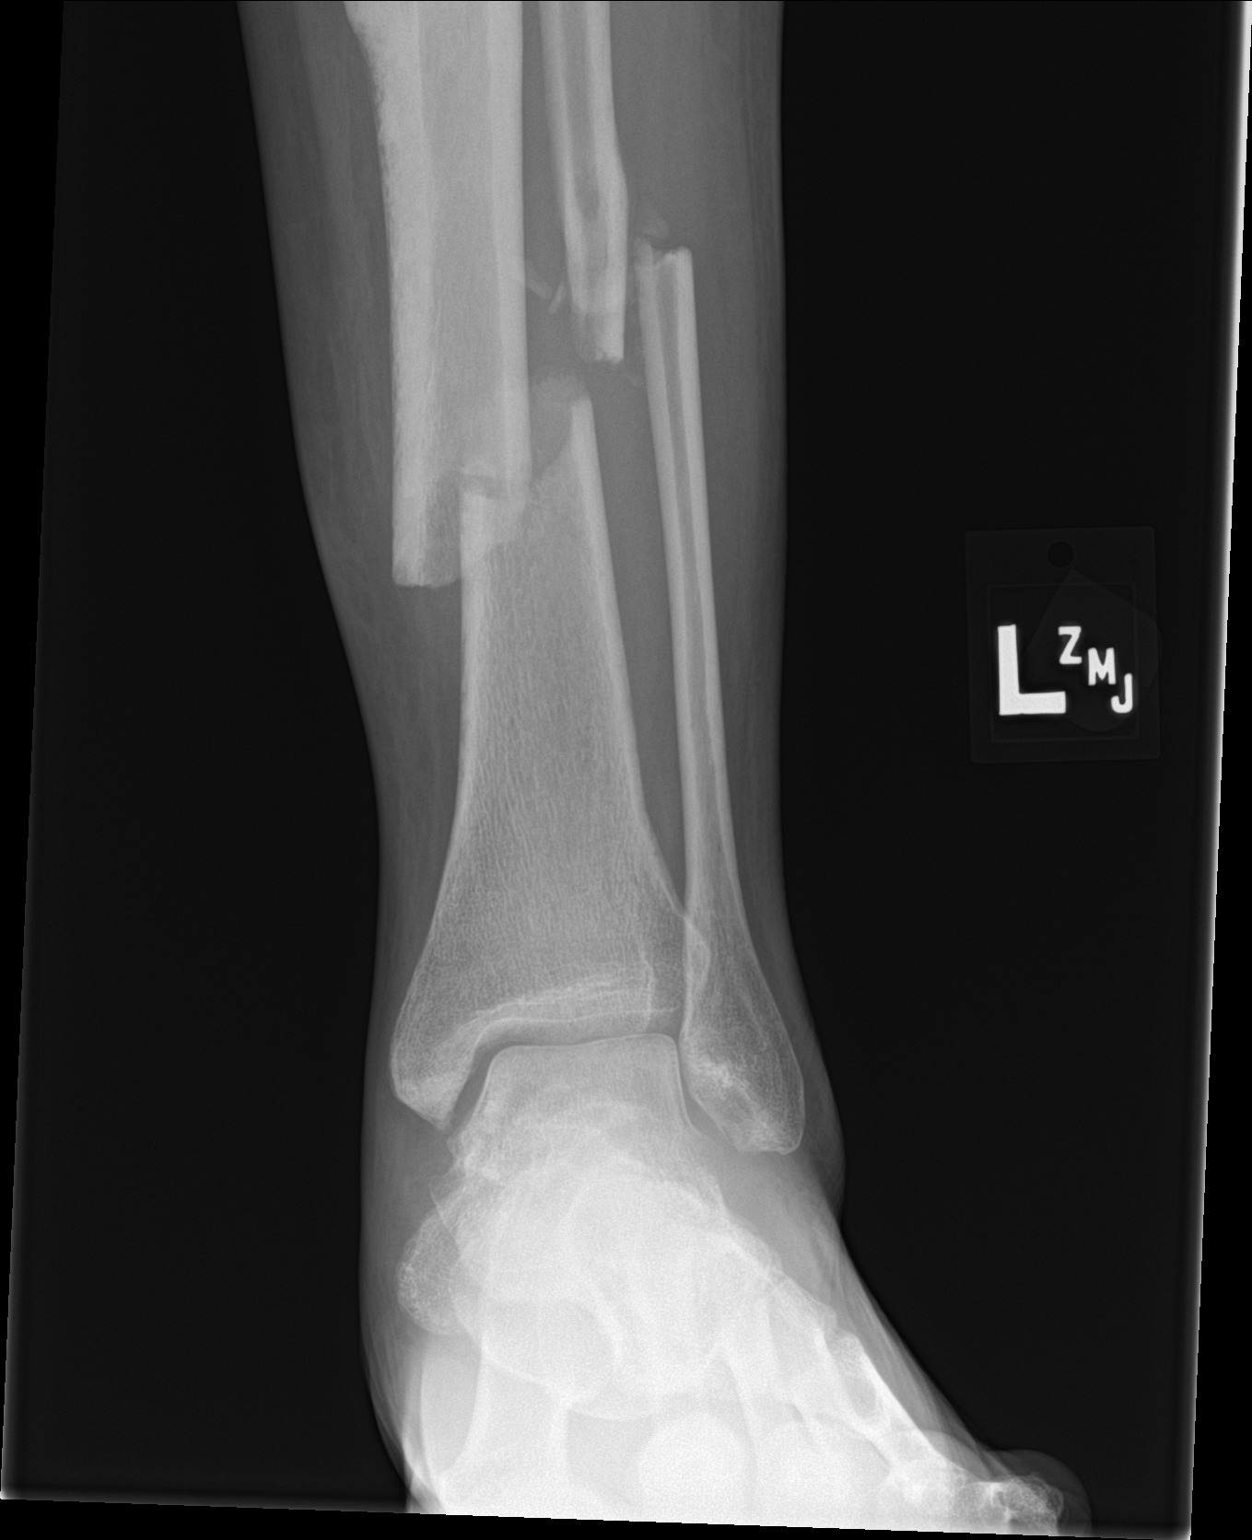

[ankle obl]
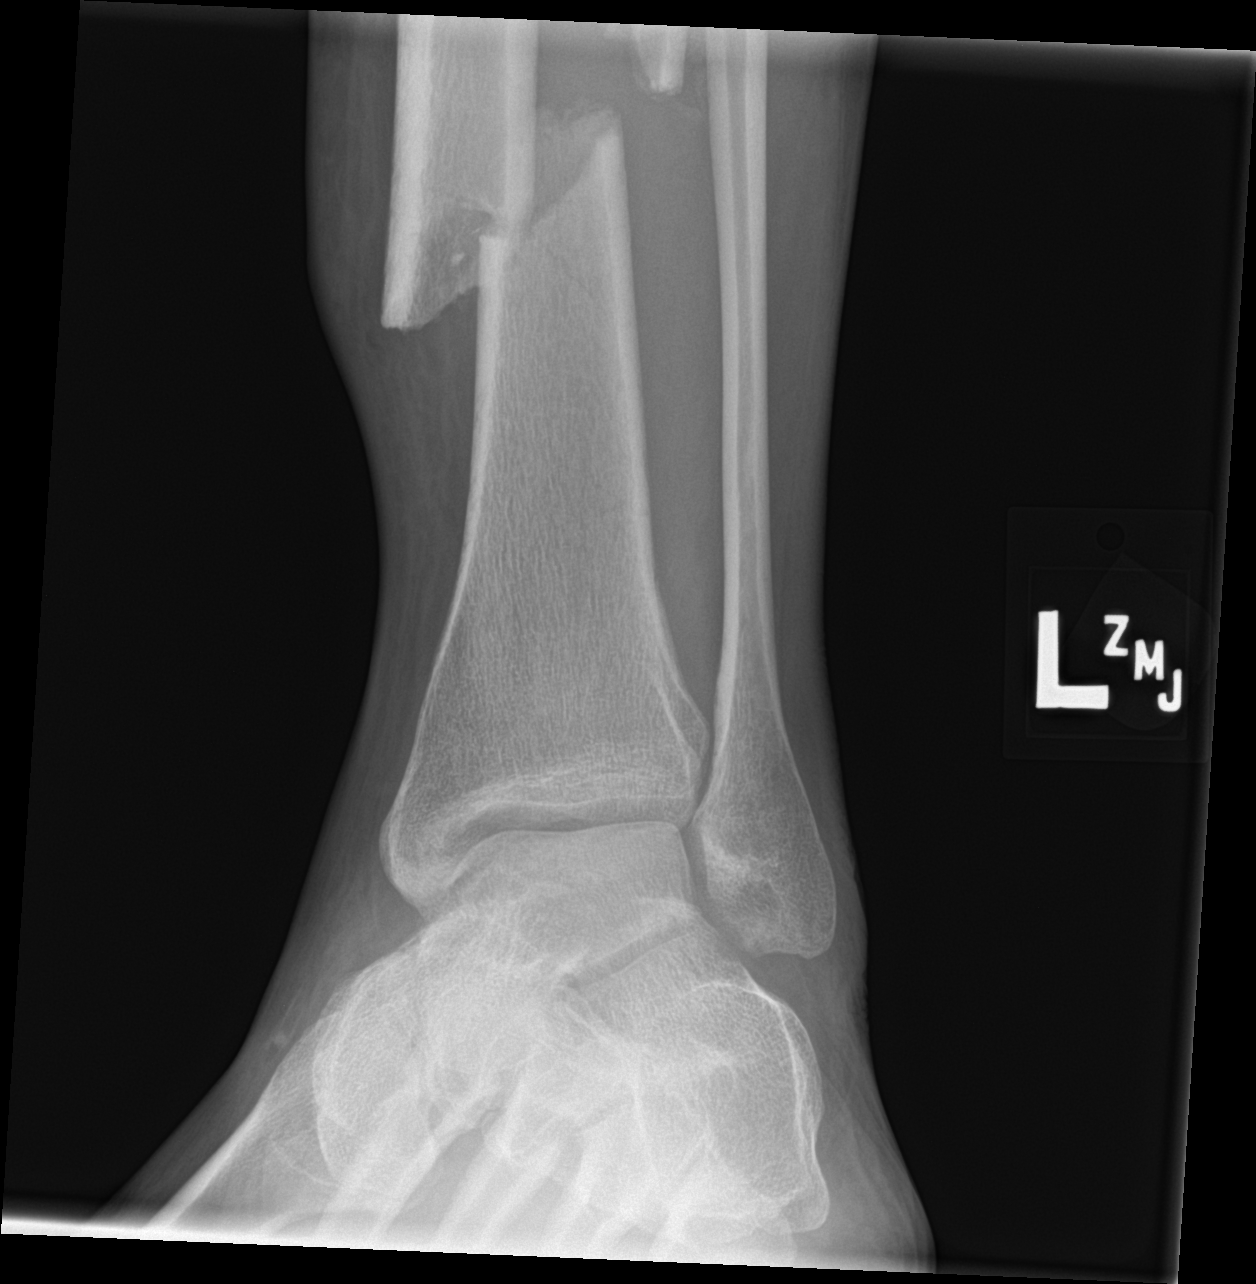

[ankle lat]
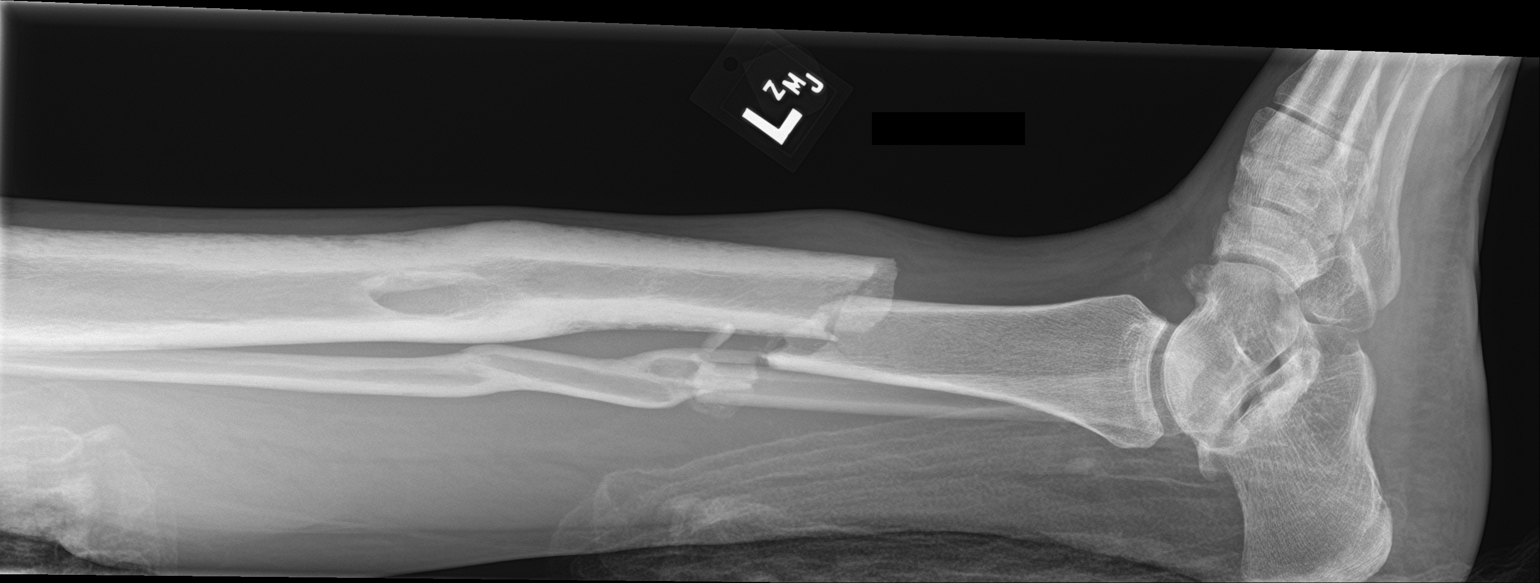

[3 of 3 positions shown; findings below may reference images not displayed]

FINDINGS: Distal tibial and fibular shaft fractures with lateral displacement
of approximately 50% at the tibia. No ankle fracture or dislocation.
IMPRESSION: Distal tibial and fibular shaft fractures with displacement.

## 2023-04-14 IMAGING — DX DG TIBIA/FIBULA 2V*L*
2 series · 2 of 2 positions shown · non-contrast
Comparison: None.

CLINICAL DATA: Fracture.

EXAM:
LEFT TIBIA AND FIBULA - 2 VIEW

[tibia ap]
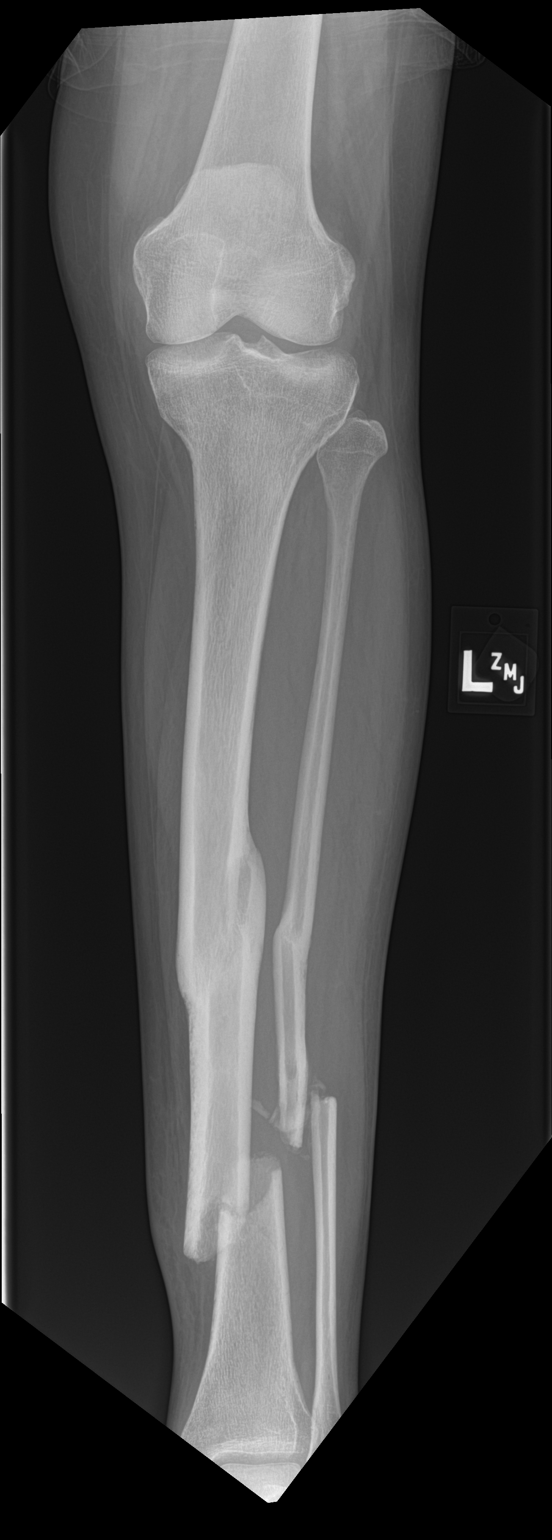

[tibia lat]
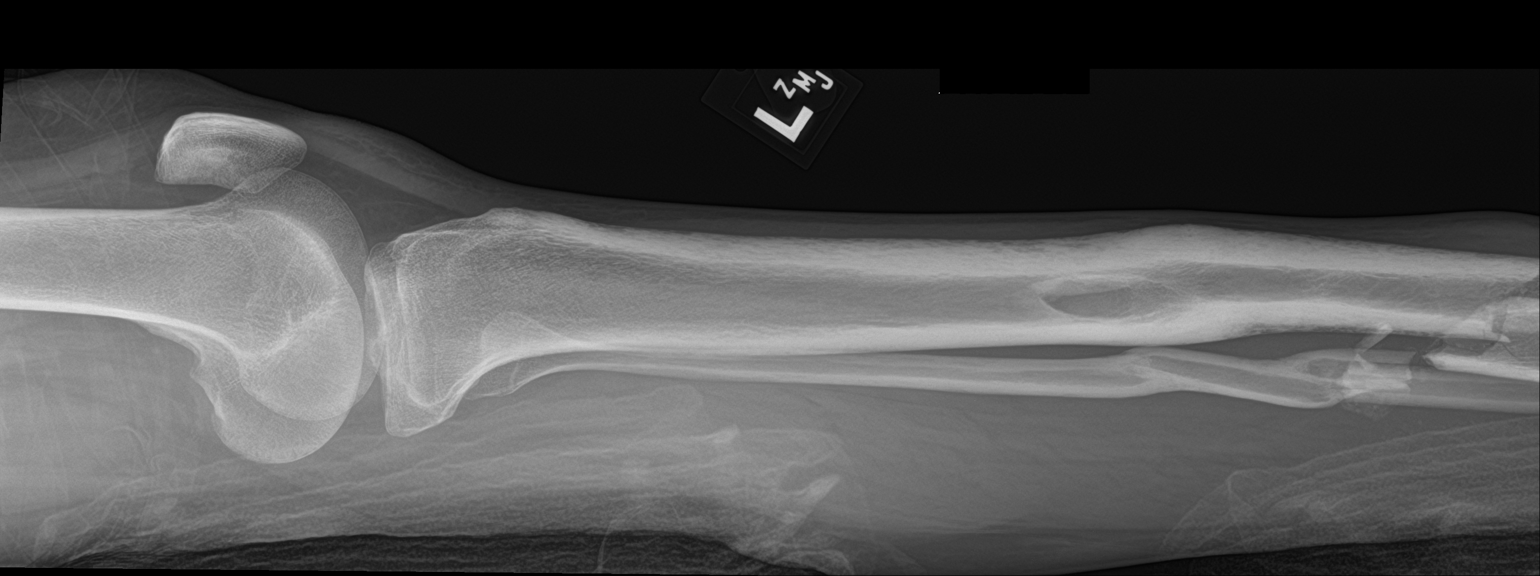

[2 of 2 positions shown; findings below may reference images not displayed]

FINDINGS: Transverse fractures through the distal shaft of the tibia and
fibula with lateral displacement. Prior and healed mid tibial and
fibular shaft fractures.
IMPRESSION: 1. Displaced distal tibial and fibular shaft fractures.
2. Remote and healed mid tibial and fibular shaft fractures.

## 2023-04-19 IMAGING — RF DG TIBIA/FIBULA 2V*L*
1 series · 9 of 9 positions shown · non-contrast
Comparison: 12/11/2020

CLINICAL DATA: Fracture left tibia and fibula

EXAM:
LEFT TIBIA AND FIBULA - 2 VIEW

[Series 1: run · 9 of 9 slices shown]
[im 1/9]
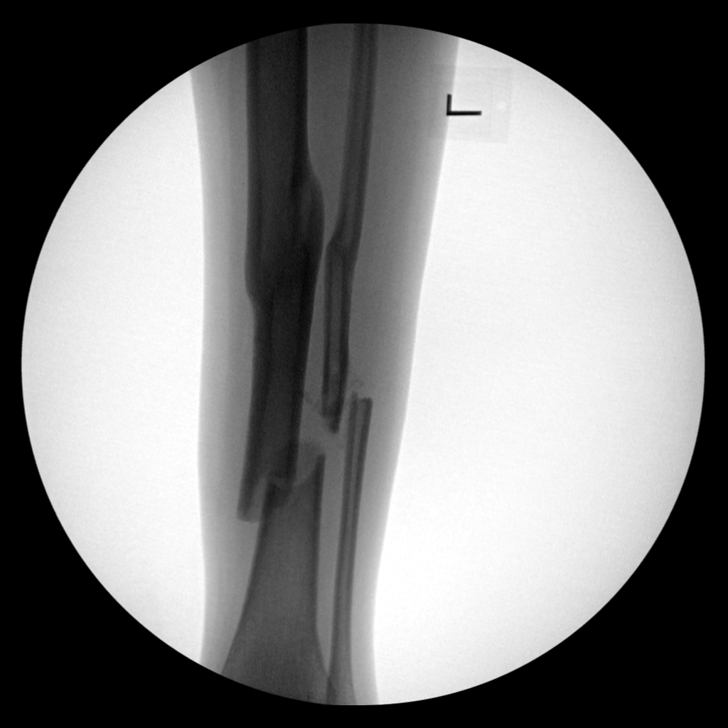
[im 2/9]
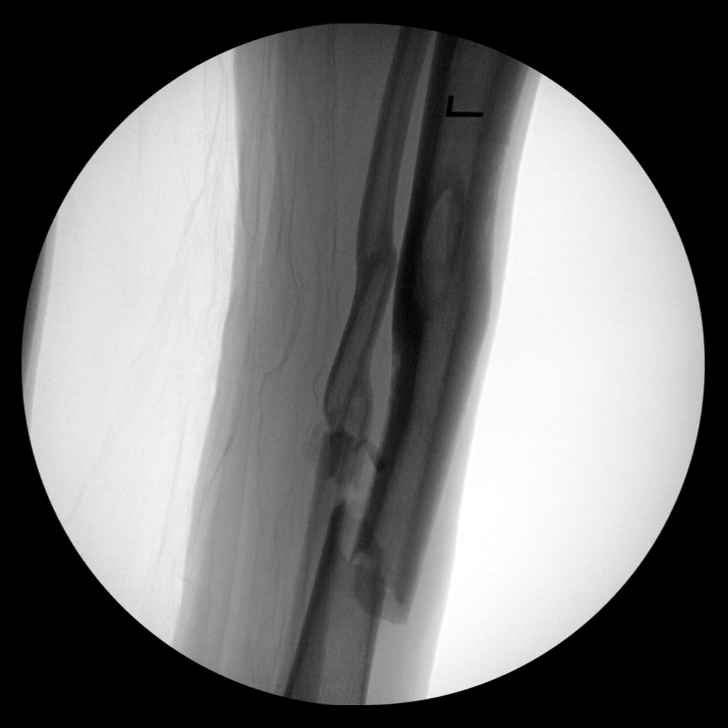
[im 3/9]
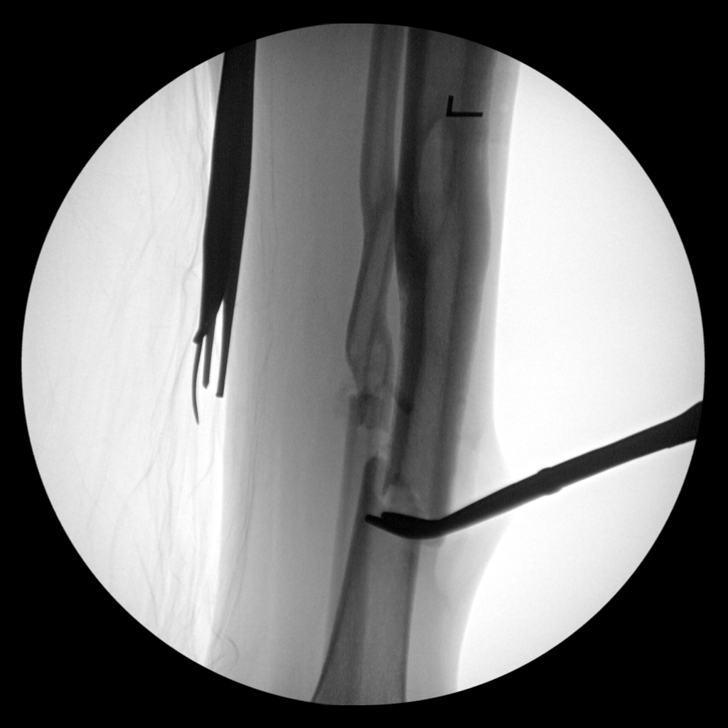
[im 4/9]
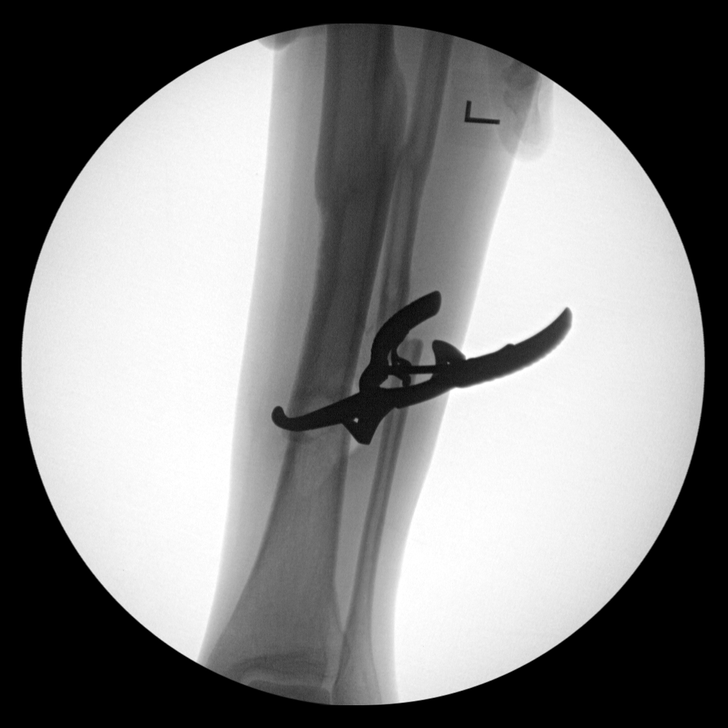
[im 5/9]
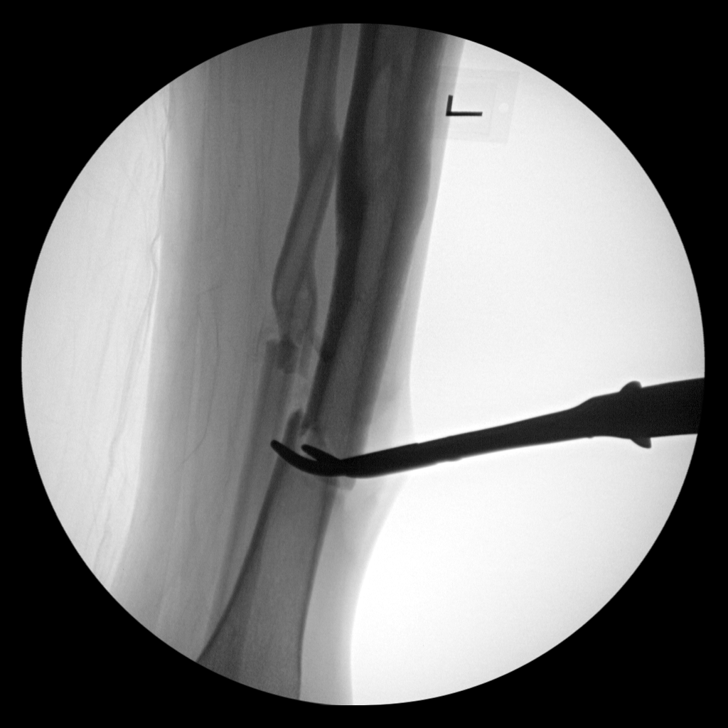
[im 6/9]
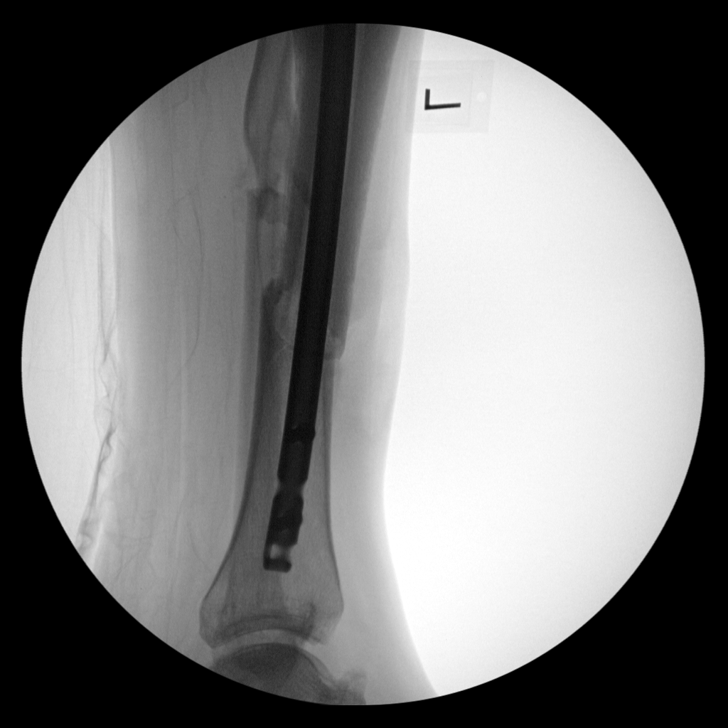
[im 7/9]
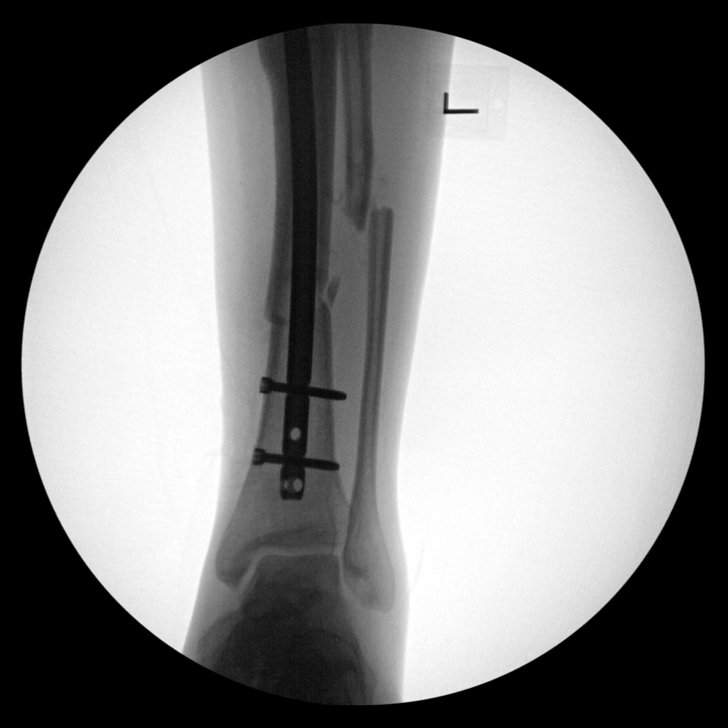
[im 8/9]
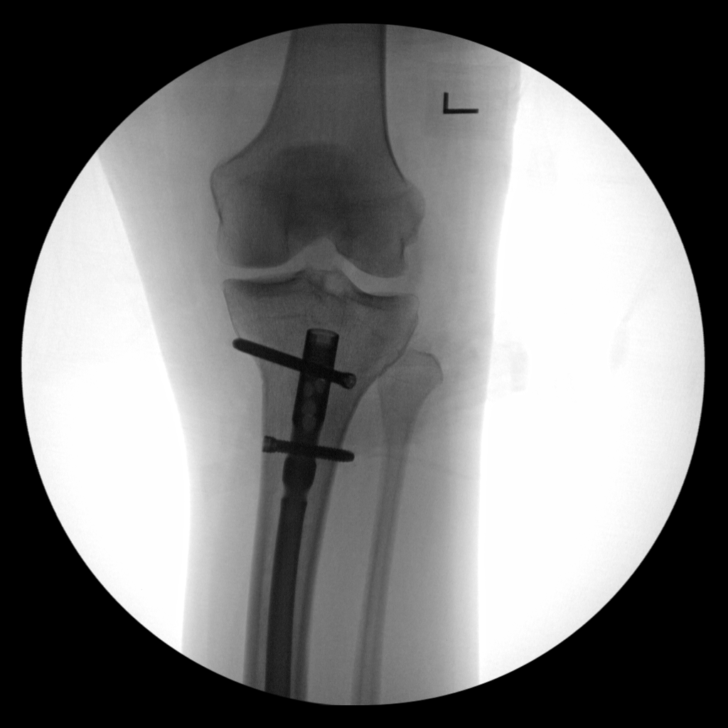
[im 9/9]
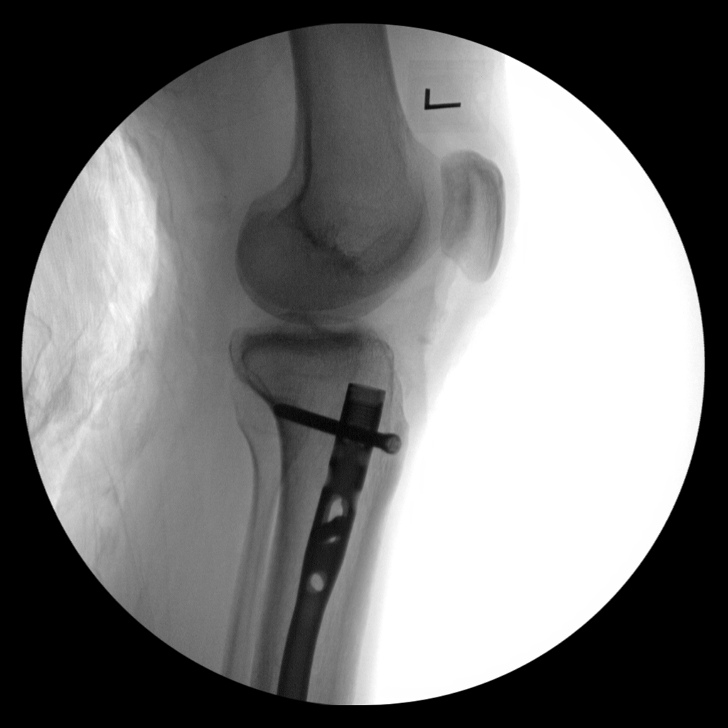

[9 of 9 positions shown; findings below may reference images not displayed]

FINDINGS: Fluoroscopic images show internal fixation of comminuted fracture of
distal shaft of tibia with intramedullary rod. There is improvement
in alignment of fracture fragments in the tibia.There is comminuted
displaced fracture in the distal shaft of fibula with lateral
displacement of distal major fracture fragment. Fluoroscopic time
was 92 seconds. Radiation dose is 2.52 mGy. Nine fluoroscopic images
are submitted for review.
IMPRESSION: Fluoroscopic assistance was provided for internal fixation of
fracture of left tibia with intramedullary rod.

## 2023-04-19 IMAGING — DX DG TIBIA/FIBULA PORT 2V*L*
1 series · 2 of 2 positions shown · non-contrast
Comparison: Operative images performed earlier today. Prior exam
dated 12/11/2020.

CLINICAL DATA: Follow-up ORIF of a left tibia fracture.

EXAM:
PORTABLE LEFT TIBIA AND FIBULA - 2 VIEW

[Series 1: leg · 0.14mm/px · 2 of 2 slices shown]
[im 1/2]
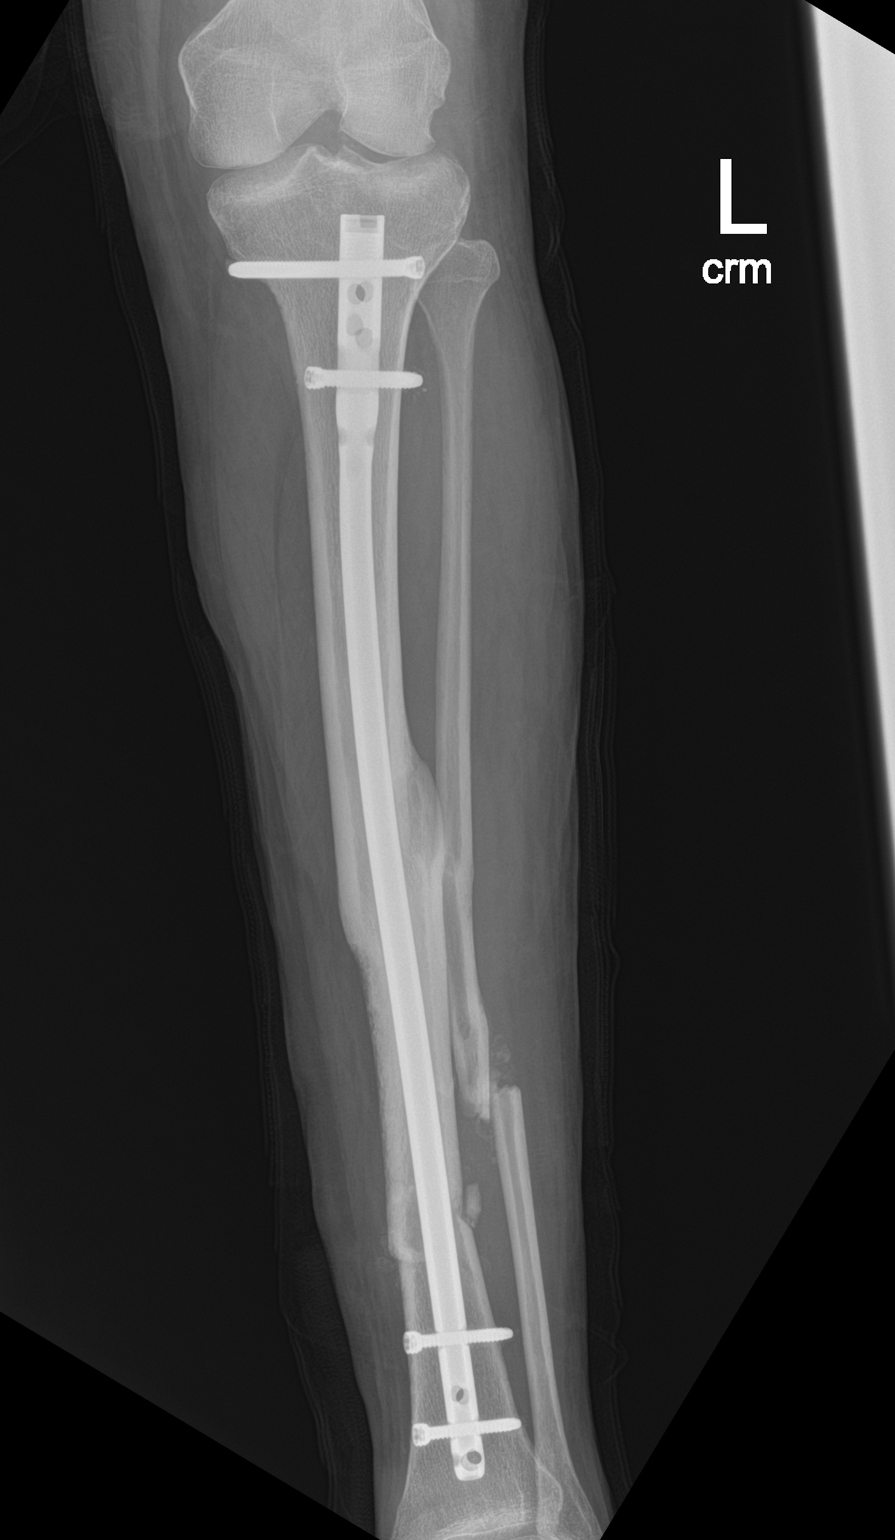
[im 2/2]
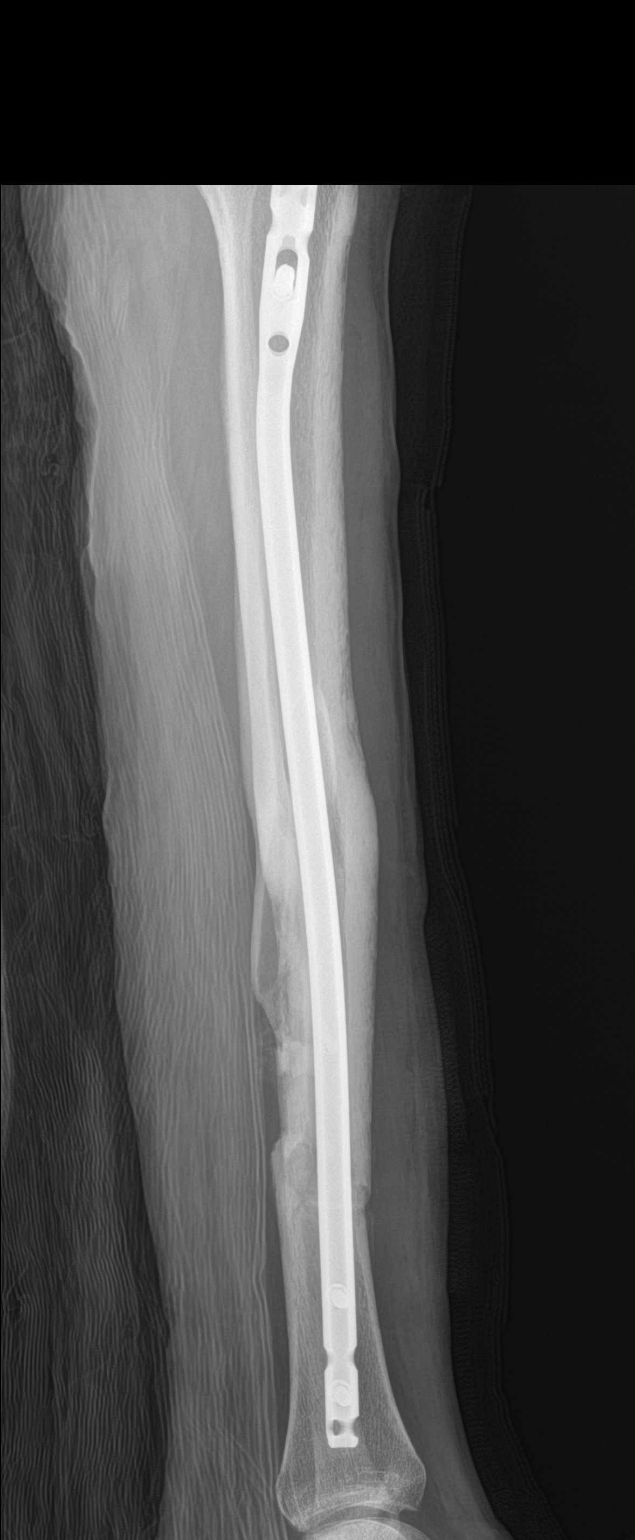

[2 of 2 positions shown; findings below may reference images not displayed]

FINDINGS: Intramedullary rod spans the new fracture of the distal tibial
diaphysis, reducing the fracture components with no significant
residual displacement and minimal angulation. Fracture of the
fibular shaft remains overlapped, but with less for shortening, now
measuring 6-7 mm.

The orthopedic hardware appears well seated.

No change in the old healed fractures of the mid tibia and fibular
shafts.
IMPRESSION: 1. Well aligned left distal tibial diaphyseal fracture following
ORIF.

## 2023-04-26 ENCOUNTER — Ambulatory Visit
Admission: EM | Admit: 2023-04-26 | Discharge: 2023-04-26 | Disposition: A | Discharge status: Home / Self Care | Attending: Internal Medicine | Admitting: Internal Medicine

## 2023-04-26 ENCOUNTER — Encounter: Payer: Self-pay | Admitting: Emergency Medicine

## 2023-04-26 DIAGNOSIS — N898 Other specified noninflammatory disorders of vagina: Secondary | ICD-10-CM

## 2023-04-26 DIAGNOSIS — Z113 Encounter for screening for infections with a predominantly sexual mode of transmission: Secondary | ICD-10-CM

## 2023-04-26 NOTE — ED Provider Notes (Signed)
 [facility]    CSN: [id number] Arrival date & time: 04/26/23  1531      History   Chief Complaint Chief Complaint  Patient presents with   Vaginal Itching    HPI Pamela Santos is a 33 y.o. female.   Patient presents with vaginal irritation, vaginal itching, vaginal discharge that has been present for about 2 to 3 months.  Reports that she has had this previously and was evaluated by gynecology.  She is not sure what the diagnosis was at that time.  Denies any exposure to STD but patient has had unprotected sexual intercourse.  Last menstrual cycle was 04/12/2023.   Vaginal Itching    Past Medical History:  Diagnosis Date   Rh negative state in antepartum period 03/06/2021   Tibia/fibula fracture, shaft, left, closed, initial encounter 12/15/2020   Added automatically from request for surgery [id number]    Patient Active Problem List   Diagnosis Date Noted   Language barrier 03/06/2021    Past Surgical History:  Procedure Laterality Date   TIBIA IM NAIL INSERTION Left 12/16/2020   Procedure: INTRAMEDULLARY (IM) NAIL TIBIAL;  Surgeon: Roby Lofts, MD;  Location: [facility] OR;  Service: Orthopedics;  Laterality: Left;    OB History     Gravida  1   Para  1   Term  1   Preterm      AB      Living  1      SAB      IAB      Ectopic      Multiple  0   Live Births  1            Home Medications    Prior to Admission medications   Medication Sig Start Date End Date Taking? Authorizing Provider  acetaminophen (TYLENOL) 325 MG tablet Take 2 tablets (650 mg total) by mouth every 4 (four) hours as needed (for pain scale < 4). Patient not taking: Reported on 10/20/2022 06/12/21   Jerre Simon, MD  ibuprofen (ADVIL) 600 MG tablet Take 1 tablet (600 mg total) by mouth every 6 (six) hours. Patient not taking: Reported on 10/20/2022 06/12/21   Jerre Simon, MD  melatonin 1 MG TABS tablet Take 1 tablet (1 mg total) by mouth at  bedtime. Patient not taking: Reported on 10/20/2022 06/12/21   Jerre Simon, MD  psyllium (METAMUCIL SMOOTH TEXTURE) 58.6 % powder Take 1 packet by mouth 2 (two) times daily. Patient not taking: Reported on 10/20/2022 08/10/21   [place] Bing, MD    Family History Family History  Problem Relation Age of Onset   Diabetes Neg Hx    Hypertension Neg Hx    Cancer Neg Hx     Social History Social History   Tobacco Use   Smoking status: Never   Smokeless tobacco: Never  Vaping Use   Vaping status: Never Used  Substance Use Topics   Alcohol use: Not Currently   Drug use: Never     Allergies   Patient has no known allergies.   Review of Systems Review of Systems Per HPI  Physical Exam Triage Vital Signs ED Triage Vitals  Encounter Vitals Group     BP 04/26/23 1752 118/74     Systolic BP Percentile --      Diastolic BP Percentile --      Pulse Rate 04/26/23 1752 72     Resp 04/26/23 1752 16     Temp 04/26/23 1752  97.9 F (36.6 C)     Temp Source 04/26/23 1752 Oral     SpO2 04/26/23 1752 97 %     Weight --      Height --      Head Circumference --      Peak Flow --      Pain Score 04/26/23 1750 9     Pain Loc --      Pain Education --      Exclude from Growth Chart --    No data found.  Updated Vital Signs BP 118/74 (BP Location: Right Arm)   Pulse 72   Temp 97.9 F (36.6 C) (Oral)   Resp 16   LMP 04/12/2023 (Approximate)   SpO2 97%   Breastfeeding No   Visual Acuity Right Eye Distance:   Left Eye Distance:   Bilateral Distance:    Right Eye Near:   Left Eye Near:    Bilateral Near:     Physical Exam Exam conducted with a chaperone present.  Constitutional:      General: She is not in acute distress.    Appearance: Normal appearance. She is not toxic-appearing or diaphoretic.  HENT:     Head: Normocephalic and atraumatic.  Eyes:     Extraocular Movements: Extraocular movements intact.     Conjunctiva/sclera: Conjunctivae normal.   Pulmonary:     Effort: Pulmonary effort is normal.  Genitourinary:    Comments: No obvious abnormality noted to vaginal area.  No lesions or rash.  No discoloration. Neurological:     General: No focal deficit present.     Mental Status: She is alert and oriented to person, place, and time. Mental status is at baseline.  Psychiatric:        Mood and Affect: Mood normal.        Behavior: Behavior normal.        Thought Content: Thought content normal.        Judgment: Judgment normal.      UC Treatments / Results  Labs (all labs ordered are listed, but only abnormal results are displayed) Labs Reviewed  CERVICOVAGINAL ANCILLARY ONLY    EKG   Radiology No results found.  Procedures Procedures (including critical care time)  Medications Ordered in UC Medications - No data to display  Initial Impression / Assessment and Plan / UC Course  I have reviewed the triage vital signs and the nursing notes.  Pertinent labs & imaging results that were available during my care of the patient were reviewed by me and considered in my medical decision making (see chart for details).     Cervicovaginal swab pending.  Awaiting results.  No obvious abnormality noted to physical exam.  Advised patient to follow-up with gynecology.  Patient verbalized understanding and was agreeable with plan.  Patient declined interpreter. Final Clinical Impressions(s) / UC Diagnoses   Final diagnoses:  Vaginal irritation  Vaginal discharge  Screening examination for venereal disease     Discharge Instructions      Vaginal swab is pending.  Will call if anything is positive.  Please follow-up with your gynecologist.    ED Prescriptions   None    PDMP not reviewed this encounter.   Gustavus Bryant, [place] 04/26/23 [phone]

## 2023-04-26 NOTE — ED Triage Notes (Addendum)
 Pt presents with vaginal itching and discomfort x 3 months. Pt is in excruciating pain. Hard to sit down. Pt denies vomiting. Pt states it hurts to go to restroom. Pt says there is discharge without a strong odor.

## 2023-04-26 NOTE — Discharge Instructions (Signed)
 Vaginal swab is pending.  Will call if anything is positive.  Please follow-up with your gynecologist.

## 2023-04-27 ENCOUNTER — Telehealth (HOSPITAL_COMMUNITY): Payer: Self-pay

## 2023-04-27 LAB — CERVICOVAGINAL ANCILLARY ONLY
Bacterial Vaginitis (gardnerella): POSITIVE — AB
Candida Glabrata: NEGATIVE
Candida Vaginitis: POSITIVE — AB
Chlamydia: NEGATIVE
Comment: NEGATIVE
Comment: NEGATIVE
Comment: NEGATIVE
Comment: NEGATIVE
Comment: NEGATIVE
Comment: NORMAL
Neisseria Gonorrhea: NEGATIVE
Trichomonas: NEGATIVE

## 2023-04-27 MED ORDER — FLUCONAZOLE 150 MG PO TABS: 150.0000 mg | ORAL_TABLET | Freq: Once | ORAL | 0 refills | Status: AC

## 2023-04-27 MED ORDER — METRONIDAZOLE 500 MG PO TABS: 500.0000 mg | ORAL_TABLET | Freq: Two times a day (BID) | ORAL | 0 refills | Status: AC

## 2023-04-27 NOTE — Telephone Encounter (Signed)
 Per protocol, pt requires tx with metronidazole and Diflucan.  Rx sent to pharmacy on file.

## 2023-05-01 ENCOUNTER — Ambulatory Visit

## 2023-06-27 ENCOUNTER — Emergency Department (HOSPITAL_COMMUNITY)

## 2023-06-27 ENCOUNTER — Ambulatory Visit

## 2023-06-27 ENCOUNTER — Encounter (HOSPITAL_COMMUNITY): Payer: Self-pay

## 2023-06-27 ENCOUNTER — Emergency Department (HOSPITAL_COMMUNITY)
Admission: EM | Admit: 2023-06-27 | Discharge: 2023-06-27 | Disposition: A | Discharge status: Home / Self Care | Attending: Student | Admitting: Student

## 2023-06-27 DIAGNOSIS — Z3A01 Less than 8 weeks gestation of pregnancy: Secondary | ICD-10-CM | POA: Insufficient documentation

## 2023-06-27 DIAGNOSIS — R1084 Generalized abdominal pain: Secondary | ICD-10-CM | POA: Diagnosis not present

## 2023-06-27 DIAGNOSIS — B9689 Other specified bacterial agents as the cause of diseases classified elsewhere: Secondary | ICD-10-CM | POA: Diagnosis not present

## 2023-06-27 DIAGNOSIS — B3731 Acute candidiasis of vulva and vagina: Secondary | ICD-10-CM | POA: Diagnosis not present

## 2023-06-27 DIAGNOSIS — R7309 Other abnormal glucose: Secondary | ICD-10-CM | POA: Diagnosis not present

## 2023-06-27 DIAGNOSIS — O23591 Infection of other part of genital tract in pregnancy, first trimester: Secondary | ICD-10-CM | POA: Insufficient documentation

## 2023-06-27 DIAGNOSIS — B379 Candidiasis, unspecified: Secondary | ICD-10-CM

## 2023-06-27 DIAGNOSIS — Z3201 Encounter for pregnancy test, result positive: Secondary | ICD-10-CM

## 2023-06-27 DIAGNOSIS — O26891 Other specified pregnancy related conditions, first trimester: Secondary | ICD-10-CM | POA: Diagnosis not present

## 2023-06-27 LAB — COMPREHENSIVE METABOLIC PANEL WITH GFR
ALT: 41 U/L (ref 0–44)
AST: 38 U/L (ref 15–41)
Albumin: 4.4 g/dL (ref 3.5–5.0)
Alkaline Phosphatase: 67 U/L (ref 38–126)
Anion gap: 9 (ref 5–15)
BUN: 10 mg/dL (ref 6–20)
CO2: 21 mmol/L — ABNORMAL LOW (ref 22–32)
Calcium: 9.8 mg/dL (ref 8.9–10.3)
Chloride: 104 mmol/L (ref 98–111)
Creatinine, Ser: 0.85 mg/dL (ref 0.44–1.00)
GFR, Estimated: >60 mL/min (ref >60)
Glucose, Bld: 131 mg/dL — ABNORMAL HIGH (ref 70–99)
Potassium: 3.4 mmol/L — ABNORMAL LOW (ref 3.5–5.1)
Sodium: 134 mmol/L — ABNORMAL LOW (ref 135–145)
Total Bilirubin: 0.6 mg/dL (ref 0.0–1.2)
Total Protein: 8.5 g/dL — ABNORMAL HIGH (ref 6.5–8.1)

## 2023-06-27 LAB — CBC
HCT: 41 % (ref 36.0–46.0)
Hemoglobin: 13.3 g/dL (ref 12.0–15.0)
MCH: 29 pg (ref 26.0–34.0)
MCHC: 32.4 g/dL (ref 30.0–36.0)
MCV: 89.5 fL (ref 80.0–100.0)
Platelets: 292 10*3/uL (ref 150–400)
RBC: 4.58 MIL/uL (ref 3.87–5.11)
RDW: 14 % (ref 11.5–15.5)
WBC: 8.1 10*3/uL (ref 4.0–10.5)
nRBC: 0 % (ref 0.0–0.2)

## 2023-06-27 LAB — GC/CHLAMYDIA PROBE AMP (~~LOC~~) NOT AT ARMC
Chlamydia: NEGATIVE
Comment: NEGATIVE
Comment: NORMAL
Neisseria Gonorrhea: NEGATIVE

## 2023-06-27 LAB — URINALYSIS, ROUTINE W REFLEX MICROSCOPIC
Bacteria, UA: NONE SEEN
Bilirubin Urine: NEGATIVE
Glucose, UA: NEGATIVE mg/dL
Hgb urine dipstick: NEGATIVE
Ketones, ur: NEGATIVE mg/dL
Nitrite: NEGATIVE
Protein, ur: NEGATIVE mg/dL
Specific Gravity, Urine: 1.013 (ref 1.005–1.030)
pH: 5 (ref 5.0–8.0)

## 2023-06-27 LAB — WET PREP, GENITAL
Sperm: NONE SEEN
Trich, Wet Prep: NONE SEEN
WBC, Wet Prep HPF POC: <10 (ref <10)

## 2023-06-27 LAB — HCG, SERUM, QUALITATIVE: Preg, Serum: POSITIVE — AB

## 2023-06-27 LAB — HCG, QUANTITATIVE, PREGNANCY: hCG, Beta Chain, Quant, S: 7557 m[IU]/mL — ABNORMAL HIGH (ref <5)

## 2023-06-27 LAB — LIPASE, BLOOD: Lipase: 46 U/L (ref 11–51)

## 2023-06-27 MED ORDER — LIDOCAINE VISCOUS HCL 2 % MT SOLN
15.0000 mL | Freq: Once | OROMUCOSAL | Status: AC
  Administered 2023-06-27: 15 mL via ORAL
  Filled 2023-06-27: qty 15

## 2023-06-27 MED ORDER — CLOTRIMAZOLE 1 % VA CREA
1.0000 | TOPICAL_CREAM | Freq: Every day | VAGINAL | Status: DC
  Administered 2023-06-27: 1 via VAGINAL
  Filled 2023-06-27: qty 45

## 2023-06-27 MED ORDER — METRONIDAZOLE 500 MG PO TABS
2000.0000 mg | ORAL_TABLET | Freq: Once | ORAL | Status: AC
  Administered 2023-06-27: 2000 mg via ORAL
  Filled 2023-06-27: qty 4

## 2023-06-27 MED ORDER — ALUM & MAG HYDROXIDE-SIMETH 200-200-20 MG/5ML PO SUSP
30.0000 mL | Freq: Once | ORAL | Status: AC
  Administered 2023-06-27: 30 mL via ORAL
  Filled 2023-06-27: qty 30

## 2023-06-27 NOTE — ED Provider Notes (Signed)
 [place] EMERGENCY DEPARTMENT AT [facility] Provider Note   CSN: [id number] Arrival date & time: 06/27/23  0104     History  Chief Complaint  Patient presents with   Abdominal Pain    Pamela Santos is a 33 y.o. female presents emergency department for evaluation of generalized abdominal pain over the last 3 days. Also complains of vaginal discharge, itching, burning with urination. She denies nausea, vomiting, fevers.  Last BM was this morning and loose.   Abdominal Pain      Home Medications Prior to Admission medications   Medication Sig Start Date End Date Taking? Authorizing Provider  acetaminophen  (TYLENOL ) 325 MG tablet Take 2 tablets (650 mg total) by mouth every 4 (four) hours as needed (for pain scale < 4). Patient not taking: Reported on 10/20/2022 06/12/21   Goble Last, MD  ibuprofen  (ADVIL ) 600 MG tablet Take 1 tablet (600 mg total) by mouth every 6 (six) hours. Patient not taking: Reported on 10/20/2022 06/12/21   Goble Last, MD  melatonin 1 MG TABS tablet Take 1 tablet (1 mg total) by mouth at bedtime. Patient not taking: Reported on 10/20/2022 06/12/21   Goble Last, MD  psyllium (METAMUCIL SMOOTH TEXTURE) 58.6 % powder Take 1 packet by mouth 2 (two) times daily. Patient not taking: Reported on 10/20/2022 08/10/21   Raynell Caller, MD      Allergies    Patient has no known allergies.    Review of Systems   Review of Systems  Gastrointestinal:  Positive for abdominal pain.    Physical Exam Updated Vital Signs BP 110/76 (BP Location: Right Arm)   Pulse 82   Temp 97.8 F (36.6 C) (Oral)   Resp 18   LMP 05/23/2023 (Exact Date)   SpO2 100%  Physical Exam Vitals and nursing note reviewed. Exam conducted with a chaperone present.  Constitutional:      General: She is not in acute distress.    Appearance: Normal appearance.  HENT:     Head: Normocephalic and atraumatic.  Eyes:     Conjunctiva/sclera: Conjunctivae normal.   Cardiovascular:     Rate and Rhythm: Normal rate.  Pulmonary:     Effort: Pulmonary effort is normal. No respiratory distress.  Abdominal:     Tenderness: There is generalized abdominal tenderness. There is no right CVA tenderness, left CVA tenderness, guarding or rebound.     Comments: Nonsurgical abdomen with no peritonitis  Genitourinary:    General: Normal vulva.     Exam position: Supine.     Vagina: No foreign body.     Cervix: No cervical motion tenderness, friability, erythema or cervical bleeding.     Comments: Curd like discharge consistent with yeast in vaginal vault. No CMT nor bleeding Skin:    Coloration: Skin is not jaundiced or pale.  Neurological:     Mental Status: She is alert. Mental status is at baseline.   Elnor Hail RN chaperoned GU exam  ED Results / Procedures / Treatments   Labs (all labs ordered are listed, but only abnormal results are displayed) Labs Reviewed  WET PREP, GENITAL - Abnormal; Notable for the following components:      Result Value   Yeast Wet Prep HPF POC PRESENT (*)    Clue Cells Wet Prep HPF POC PRESENT (*)    All other components within normal limits  COMPREHENSIVE METABOLIC PANEL WITH GFR - Abnormal; Notable for the following components:   Sodium 134 (*)  Potassium 3.4 (*)    CO2 21 (*)    Glucose, Bld 131 (*)    Total Protein 8.5 (*)    All other components within normal limits  URINALYSIS, ROUTINE W REFLEX MICROSCOPIC - Abnormal; Notable for the following components:   APPearance HAZY (*)    Leukocytes,Ua MODERATE (*)    All other components within normal limits  HCG, SERUM, QUALITATIVE - Abnormal; Notable for the following components:   Preg, Serum POSITIVE (*)    All other components within normal limits  HCG, QUANTITATIVE, PREGNANCY - Abnormal; Notable for the following components:   hCG, Beta Chain, Quant, S 7,557 (*)    All other components within normal limits  LIPASE, BLOOD  CBC  GC/CHLAMYDIA PROBE AMP ([facility]) NOT AT [facility]    EKG None  Radiology US  OB Comp < 14 Wks Result Date: 06/27/2023 CLINICAL DATA:  Pelvic pain. EXAM: OBSTETRIC <14 WK ULTRASOUND TECHNIQUE: Transabdominal ultrasound was performed for evaluation of the gestation as well as the maternal uterus and adnexal regions. COMPARISON:  None Available. FINDINGS: Intrauterine gestational sac: Single Yolk sac:  Not Visualized. Embryo:  Not Visualized. Cardiac Activity: Not Visualized. Heart Rate: N/A bpm MSD:  8.5 mm   5 w   4 d Subchorionic hemorrhage:  None visualized. Maternal uterus/adnexae: The right ovary is visualized and contains a 1.3 cm x 0.9 cm x 1.0 cm corpus luteum cyst. The left ovary is visualized and is normal in appearance. No pelvic free fluid is seen. IMPRESSION: Probable early intrauterine gestational sac, but no yolk sac, fetal pole, or cardiac activity yet visualized. Recommend follow-up quantitative B-HCG levels and follow-up US  in 14 days to assess viability. This recommendation follows SRU consensus guidelines: Diagnostic Criteria for Nonviable Pregnancy Early in the First Trimester. Mel Spine Med 2013; 161:0960-45. Electronically Signed   By: Virgle Grime M.D.   On: 06/27/2023 03:59   US  OB Transvaginal Result Date: 06/27/2023 CLINICAL DATA:  Pelvic pain. EXAM: OBSTETRIC <14 WK ULTRASOUND TECHNIQUE: Transabdominal ultrasound was performed for evaluation of the gestation as well as the maternal uterus and adnexal regions. COMPARISON:  None Available. FINDINGS: Intrauterine gestational sac: Single Yolk sac:  Not Visualized. Embryo:  Not Visualized. Cardiac Activity: Not Visualized. Heart Rate: N/A bpm MSD:  8.5 mm   5 w   4 d Subchorionic hemorrhage:  None visualized. Maternal uterus/adnexae: The right ovary is visualized and contains a 1.3 cm x 0.9 cm x 1.0 cm corpus luteum cyst. The left ovary is visualized and is normal in appearance. No pelvic free fluid is seen. IMPRESSION: Probable early intrauterine gestational  sac, but no yolk sac, fetal pole, or cardiac activity yet visualized. Recommend follow-up quantitative B-HCG levels and follow-up US  in 14 days to assess viability. This recommendation follows SRU consensus guidelines: Diagnostic Criteria for Nonviable Pregnancy Early in the First Trimester. Mel Spine Med 2013; 409:8119-14. Electronically Signed   By: Virgle Grime M.D.   On: 06/27/2023 03:59    Procedures Procedures    Medications Ordered in ED Medications  clotrimazole (GYNE-LOTRIMIN) vaginal cream 1 Applicatorful (1 Applicatorful Vaginal Given 06/27/23 0452)  alum & mag hydroxide-simeth (MAALOX/MYLANTA) 200-200-20 MG/5ML suspension 30 mL (30 mLs Oral Given 06/27/23 0447)    And  lidocaine  (XYLOCAINE ) 2 % viscous mouth solution 15 mL (15 mLs Oral Given 06/27/23 0447)  metroNIDAZOLE  (FLAGYL ) tablet 2,000 mg (2,000 mg Oral Given 06/27/23 0453)    ED Course/ Medical Decision Making/ A&P  Medical Decision Making Amount and/or Complexity of Data Reviewed Labs: ordered. Radiology: ordered.  Risk OTC drugs. Prescription drug management.   Patient presents to the ED for concern of abd pain, vaginal dx, this involves an extensive number of treatment options, and is a complaint that carries with it a high risk of complications and morbidity.  The differential diagnosis includes BV, trich, STD, UTI, reflux, intraabdominal patho, pregnancy, ectopic. Not exhaustive list   Co morbidities that complicate the patient evaluation  None   Additional history obtained:  Additional history obtained from Nursing   External records from outside source obtained and reviewed including triage RN note   Lab Tests:  I Ordered, and personally interpreted labs.  The pertinent results include:   hCG 7557 CBG 131 Moderate leukocytes and UA Yeast and clue cells on wet prep   Imaging Studies ordered:  I ordered imaging studies including TVUS w doppler  I  independently visualized and interpreted imaging which showed Probable early intrauterine gestational sac, but no yolk sac, fetal pole, or cardiac activity yet visualized. Recommend follow-up quantitative B-HCG levels and follow-up US  in 14 days to assess viability  I agree with the radiologist interpretation     Medicines ordered and prescription drug management:  I ordered medication including GI cocktail, flagyl  and clotrimazole  for BV and yeast  Reevaluation of the patient after these medicines showed that the patient improved I have reviewed the patients home medicines and have made adjustments as needed     Problem List / ED Course:  Abd pain Generalized but worse in upper quadrants.  No leukocytosis nor severe electrolyte derangements UA is noninfectious appearing.  There are leukocytes but this is likely secondary to yeast, clue cells found on the wet prep. hCG quant 7557.  Ultrasound shows suspected IUP without yolk sac, pole.   Could be reflux component so provided GI cocktail - improved Has GYN appointment tomorrow. Will have her f/u with them BV Yeast infection Provided treatment for BV in ED as well as prescription for intravaginal clotrimazole for yeast infection   Reevaluation:  After the interventions noted above, I reevaluated the patient and found that they have :improved     Dispostion:  After consideration of the diagnostic results and the patients response to treatment, I feel that the patent would benefit from outpatient management with GYN follow-up.   Discussed ED workup, disposition, return to ED precautions with patient who expresses understanding agrees with plan.  All questions answered to their satisfaction.  They are agreeable to plan.  Discharge instructions provided on paperwork  Final Clinical Impression(s) / ED Diagnoses Final diagnoses:  Generalized abdominal pain  Positive pregnancy test  Bacterial vaginosis in pregnancy  Yeast  infection    Rx / DC Orders ED Discharge Orders     None         Royann Cords, PA 06/27/23 1610    Karlyn Overman, MD 06/27/23 [phone]

## 2023-06-27 NOTE — ED Triage Notes (Signed)
 Patient arrived with upper abdominal pain over the last three days. Declines NVD. States she has had a positive pregnancy test.

## 2023-06-27 NOTE — ED Notes (Signed)
 [place] at bedside

## 2023-06-27 NOTE — Discharge Instructions (Addendum)
 Merci de nous avoir permis de vous valuer aujourd'hui. Votre chographie a rvl une probable formation prcoce du sac gestationnel intra-utrin, mais aucun sac vitellin, ple fotal ni activit cardiaque n'ont encore t visualiss, car il s'agit [employer]. Veuillez consulter votre gyncologue comme prvu pour [employer] de la prise en charge. Prenez des vitamines prnatales.  Nous vous proposons un traitement contre la vaginose bactrienne (prolifration bactrienne) associe aux troubles de l'rection. Veuillez vous procurer des suppositoires vaginaux et les utiliser pendant les sept prochains jours pour traiter une mycose vaginale.  Retournez aux urgences en cas de saignements vaginaux ou d'aggravation des douleurs abdominales.

## 2023-07-19 ENCOUNTER — Emergency Department (HOSPITAL_COMMUNITY)

## 2023-07-19 ENCOUNTER — Other Ambulatory Visit: Payer: Self-pay

## 2023-07-19 ENCOUNTER — Emergency Department (HOSPITAL_COMMUNITY)
Admission: EM | Admit: 2023-07-19 | Discharge: 2023-07-19 | Disposition: A | Discharge status: Home / Self Care | Attending: Emergency Medicine | Admitting: Emergency Medicine

## 2023-07-19 ENCOUNTER — Encounter (HOSPITAL_COMMUNITY): Payer: Self-pay

## 2023-07-19 DIAGNOSIS — E876 Hypokalemia: Secondary | ICD-10-CM | POA: Diagnosis not present

## 2023-07-19 DIAGNOSIS — O219 Vomiting of pregnancy, unspecified: Secondary | ICD-10-CM | POA: Diagnosis not present

## 2023-07-19 DIAGNOSIS — O3481 Maternal care for other abnormalities of pelvic organs, first trimester: Secondary | ICD-10-CM | POA: Diagnosis not present

## 2023-07-19 DIAGNOSIS — O2310 Infections of bladder in pregnancy, unspecified trimester: Secondary | ICD-10-CM | POA: Insufficient documentation

## 2023-07-19 DIAGNOSIS — N83201 Unspecified ovarian cyst, right side: Secondary | ICD-10-CM | POA: Insufficient documentation

## 2023-07-19 DIAGNOSIS — Z3A08 8 weeks gestation of pregnancy: Secondary | ICD-10-CM | POA: Insufficient documentation

## 2023-07-19 DIAGNOSIS — O26891 Other specified pregnancy related conditions, first trimester: Secondary | ICD-10-CM | POA: Diagnosis not present

## 2023-07-19 DIAGNOSIS — O2311 Infections of bladder in pregnancy, first trimester: Secondary | ICD-10-CM | POA: Diagnosis not present

## 2023-07-19 DIAGNOSIS — O99281 Endocrine, nutritional and metabolic diseases complicating pregnancy, first trimester: Secondary | ICD-10-CM | POA: Diagnosis not present

## 2023-07-19 DIAGNOSIS — O208 Other hemorrhage in early pregnancy: Secondary | ICD-10-CM | POA: Diagnosis not present

## 2023-07-19 DIAGNOSIS — N3000 Acute cystitis without hematuria: Secondary | ICD-10-CM

## 2023-07-19 LAB — URINALYSIS, ROUTINE W REFLEX MICROSCOPIC
Bilirubin Urine: NEGATIVE
Glucose, UA: NEGATIVE mg/dL
Hgb urine dipstick: NEGATIVE
Ketones, ur: 80 mg/dL — AB
Nitrite: NEGATIVE
Protein, ur: 100 mg/dL — AB
Specific Gravity, Urine: 1.029 (ref 1.005–1.030)
pH: 5 (ref 5.0–8.0)

## 2023-07-19 LAB — COMPREHENSIVE METABOLIC PANEL WITH GFR
ALT: 17 U/L (ref 0–44)
AST: 21 U/L (ref 15–41)
Albumin: 4.8 g/dL (ref 3.5–5.0)
Alkaline Phosphatase: 51 U/L (ref 38–126)
Anion gap: 13 (ref 5–15)
BUN: 8 mg/dL (ref 6–20)
CO2: 21 mmol/L — ABNORMAL LOW (ref 22–32)
Calcium: 10.3 mg/dL (ref 8.9–10.3)
Chloride: 102 mmol/L (ref 98–111)
Creatinine, Ser: 0.38 mg/dL — ABNORMAL LOW (ref 0.44–1.00)
GFR, Estimated: >60 mL/min (ref >60)
Glucose, Bld: 78 mg/dL (ref 70–99)
Potassium: 3.2 mmol/L — ABNORMAL LOW (ref 3.5–5.1)
Sodium: 136 mmol/L (ref 135–145)
Total Bilirubin: 0.7 mg/dL (ref 0.0–1.2)
Total Protein: 9.4 g/dL — ABNORMAL HIGH (ref 6.5–8.1)

## 2023-07-19 LAB — CBC
HCT: 42.7 % (ref 36.0–46.0)
Hemoglobin: 14.1 g/dL (ref 12.0–15.0)
MCH: 29.3 pg (ref 26.0–34.0)
MCHC: 33 g/dL (ref 30.0–36.0)
MCV: 88.6 fL (ref 80.0–100.0)
Platelets: 327 10*3/uL (ref 150–400)
RBC: 4.82 MIL/uL (ref 3.87–5.11)
RDW: 13.2 % (ref 11.5–15.5)
WBC: 7.8 10*3/uL (ref 4.0–10.5)
nRBC: 0 % (ref 0.0–0.2)

## 2023-07-19 LAB — LIPASE, BLOOD: Lipase: 42 U/L (ref 11–51)

## 2023-07-19 LAB — HCG, SERUM, QUALITATIVE: Preg, Serum: POSITIVE — AB

## 2023-07-19 LAB — HCG, QUANTITATIVE, PREGNANCY: hCG, Beta Chain, Quant, S: 184415 m[IU]/mL — ABNORMAL HIGH (ref <5)

## 2023-07-19 MED ORDER — SCOPOLAMINE 1 MG/3DAYS TD PT72: 1.0000 | MEDICATED_PATCH | TRANSDERMAL | 12 refills | Status: DC

## 2023-07-19 MED ORDER — METOCLOPRAMIDE HCL 5 MG/ML IJ SOLN
10.0000 mg | Freq: Once | INTRAMUSCULAR | Status: AC
  Administered 2023-07-19: 10 mg via INTRAVENOUS
  Filled 2023-07-19: qty 2

## 2023-07-19 MED ORDER — CEPHALEXIN 500 MG PO CAPS
500.0000 mg | ORAL_CAPSULE | Freq: Two times a day (BID) | ORAL | 0 refills | Status: AC
Start: 2023-07-19 — End: 2023-07-26

## 2023-07-19 MED ORDER — SODIUM CHLORIDE 0.9 % IV BOLUS
1000.0000 mL | Freq: Once | INTRAVENOUS | Status: AC
  Administered 2023-07-19: 1000 mL via INTRAVENOUS

## 2023-07-19 MED ORDER — POTASSIUM CHLORIDE CRYS ER 20 MEQ PO TBCR
40.0000 meq | EXTENDED_RELEASE_TABLET | Freq: Once | ORAL | Status: AC
  Administered 2023-07-19: 40 meq via ORAL
  Filled 2023-07-19: qty 2

## 2023-07-19 NOTE — ED Notes (Signed)
 Pt back from [place]

## 2023-07-19 NOTE — ED Notes (Signed)
 Multiple providers attempted to draw blood from pt, without success. She stated it is not normally difficult to get a line on her, however she stated she had been vomiting for a few hours.

## 2023-07-19 NOTE — ED Triage Notes (Signed)
 Pt arrived reporting she is 2 months pregnant. States has been constantly vomiting, had terrible episodes similar to this in first pregnancy. Patient is [redacted] weeks pregnant. Denies any vaginal bleeding. Endorses abdominal pain, more of a soreness from vomiting.

## 2023-07-19 NOTE — Discharge Instructions (Addendum)
 As we discussed, your workup in the ER today was reassuring for acute findings.  Laboratory evaluation and ultrasound imaging did not reveal any emergent concerns.  Your ultrasound did show that you have a 8-week viable intrauterine pregnancy that is progressing as expected.  It did also show that you have what looks like a cyst in your right ovary.  They recommend that you have a follow-up ultrasound in the next 6 to 12 weeks to look at this again.  This should be able to be done through your OB/GYN at some point during her routine pregnancy evaluations.  Given that you are able to eat and drink after above interventions, no further evaluation is indicated.  I have given you a prescription for scopolamine  patches that you can use as prescribed as needed.  I have also given you Keflex as it looks like you may have a urinary tract infection.  Please fill and take this as prescribed in its entirety.  Please call your OB/GYN to schedule a close follow-up appointment.  It also looks like your potassium is a little low which is common when you are dehydrated.  Please be sure you are maintaining adequate oral hydration specifically with fluids high in electrolytes such as Gatorade or Pedialyte.  Return if development of any new or worsening symptoms.  Vous avez t vu aux urgences pour Immunologist cuir chevelu de Forensic psychologist.  Nous avons ferm votre/vos lacration(s) Soil scientist. Celles-ci doivent tre [employer] 7  10 jours. Cette intervention peut tre effectue dans n'importe quel cabinet mdical, aux urgences ou [employer].  Si l'une des agrafes se Best boy, ce n'est pas grave. Veillez  garder la zone [place] propre et sche que possible. Vous pouvez Systems analyst de l'eau chaude [employer] zone, [place] ne la frottez PAS.  Soyez attentif aux signes d'infection, comme indiqu prcdemment, notamment : rougeur accrue, sensibilit ou coulement de pus. Si  cela se produit et qu'aucun antibiotique ne vous a t prescrit, veuillez consulter un mdecin pour une possible infection.  Vous pouvez prendre des [employer] en vente libre comme l'ibuprofne ou le paractamol, au besoin.

## 2023-07-19 NOTE — ED Provider Notes (Signed)
  EMERGENCY DEPARTMENT AT [facility] Provider Note   CSN: [id number] Arrival date & time: 07/19/23  1533     Patient presents with: Abdominal Pain and Emesis   Pamela Santos is a 33 y.o. female.   Patient with no pertinent past medical history presents today with complaints of nausea, vomiting, abdominal cramping. She states that she is about [redacted] weeks pregnant. This is her second pregnancy. She has not seen an OB/GYN yet, has an appointment in July. She states that she had nausea and vomiting in her previous pregnancy as well. States that during that pregnancy she was given scopolamine  patches which helped her significantly. She is requesting same. Also notes some pressure with urination is concerned for UTI. Denies vaginal bleeding or discharge.   The history is provided by the patient. A language interpreter was used (French-speaking only requiring video interpreter services).  Abdominal Pain Associated symptoms: nausea and vomiting   Emesis Associated symptoms: abdominal pain        Prior to Admission medications   Medication Sig Start Date End Date Taking? Authorizing Provider  acetaminophen  (TYLENOL ) 325 MG tablet Take 2 tablets (650 mg total) by mouth every 4 (four) hours as needed (for pain scale < 4). Patient not taking: Reported on 10/20/2022 06/12/21   Goble Last, MD  ibuprofen  (ADVIL ) 600 MG tablet Take 1 tablet (600 mg total) by mouth every 6 (six) hours. Patient not taking: Reported on 10/20/2022 06/12/21   Goble Last, MD  melatonin 1 MG TABS tablet Take 1 tablet (1 mg total) by mouth at bedtime. Patient not taking: Reported on 10/20/2022 06/12/21   Goble Last, MD  psyllium (METAMUCIL SMOOTH TEXTURE) 58.6 % powder Take 1 packet by mouth 2 (two) times daily. Patient not taking: Reported on 10/20/2022 08/10/21   Raynell Caller, MD    Allergies: Patient has no known allergies.    Review of Systems  Gastrointestinal:  Positive for  abdominal pain, nausea and vomiting.  All other systems reviewed and are negative.   Updated Vital Signs BP 101/67 (BP Location: Left Arm)   Pulse 73   Temp 97.8 F (36.6 C) (Oral)   Resp 16   Ht 5' 3 (1.6 m)   Wt 68.6 kg   LMP 05/23/2023 (Exact Date)   SpO2 97%   BMI 26.79 kg/m   Physical Exam Vitals and nursing note reviewed.  Constitutional:      General: She is not in acute distress.    Appearance: Normal appearance. She is normal weight. She is not ill-appearing, toxic-appearing or diaphoretic.  HENT:     Head: Normocephalic and atraumatic.   Cardiovascular:     Rate and Rhythm: Normal rate.  Pulmonary:     Effort: Pulmonary effort is normal. No respiratory distress.  Abdominal:     General: Abdomen is flat.     Palpations: Abdomen is soft.     Tenderness: There is generalized abdominal tenderness.   Musculoskeletal:        General: Normal range of motion.     Cervical back: Normal range of motion.   Skin:    General: Skin is warm and dry.   Neurological:     General: No focal deficit present.     Mental Status: She is alert.   Psychiatric:        Mood and Affect: Mood normal.        Behavior: Behavior normal.     (all labs ordered are listed, but  only abnormal results are displayed) Labs Reviewed  COMPREHENSIVE METABOLIC PANEL WITH GFR - Abnormal; Notable for the following components:      Result Value   Potassium 3.2 (*)    CO2 21 (*)    Creatinine, Ser 0.38 (*)    Total Protein 9.4 (*)    All other components within normal limits  HCG, SERUM, QUALITATIVE - Abnormal; Notable for the following components:   Preg, Serum POSITIVE (*)    All other components within normal limits  HCG, QUANTITATIVE, PREGNANCY - Abnormal; Notable for the following components:   hCG, Beta Chain, Quant, S 184,415 (*)    All other components within normal limits  LIPASE, BLOOD  CBC  URINALYSIS, ROUTINE W REFLEX MICROSCOPIC    EKG: None  Radiology: US  OB Comp <  14 Wks Result Date: 07/19/2023 CLINICAL DATA:  Abdominal pain and vomiting. EXAM: OBSTETRIC <14 WK ULTRASOUND TECHNIQUE: Transabdominal ultrasound was performed for evaluation of the gestation as well as the maternal uterus and adnexal regions. COMPARISON:  Jun 27, 2023 FINDINGS: Intrauterine gestational sac: Single Yolk sac:  Visualized. Embryo:  Visualized. Cardiac Activity: Visualized. Heart Rate: 175 bpm CRL:   17.7 mm   8 w 2 d                  US  EDC: February 26, 2024 Subchorionic hemorrhage:  Small (1.1 cm x 1.3 cm x 0.7 cm Maternal uterus/adnexae: The right ovary is visualized and contains a 1.4 cm x 1.2 cm x 1.4 cm cystic appearing area. The left ovary is visualized and is unremarkable. No pelvic free fluid is seen. IMPRESSION: 1. Single, viable intrauterine pregnancy at approximately 8 weeks and 2 days gestation by ultrasound evaluation. 2. Findings which may represent a resolving right corpus luteum cyst. Correlation with 6-12 week follow-up pelvic ultrasound is recommended. Electronically Signed   By: Virgle Grime M.D.   On: 07/19/2023 19:52   US  OB Transvaginal Result Date: 07/19/2023 CLINICAL DATA:  Abdominal pain and vomiting. EXAM: OBSTETRIC <14 WK ULTRASOUND TECHNIQUE: Transabdominal ultrasound was performed for evaluation of the gestation as well as the maternal uterus and adnexal regions. COMPARISON:  Jun 27, 2023 FINDINGS: Intrauterine gestational sac: Single Yolk sac:  Visualized. Embryo:  Visualized. Cardiac Activity: Visualized. Heart Rate: 175 bpm CRL:   17.7 mm   8 w 2 d                  US  EDC: February 26, 2024 Subchorionic hemorrhage:  Small (1.1 cm x 1.3 cm x 0.7 cm Maternal uterus/adnexae: The right ovary is visualized and contains a 1.4 cm x 1.2 cm x 1.4 cm cystic appearing area. The left ovary is visualized and is unremarkable. No pelvic free fluid is seen. IMPRESSION: 1. Single, viable intrauterine pregnancy at approximately 8 weeks and 2 days gestation by ultrasound  evaluation. 2. Findings which may represent a resolving right corpus luteum cyst. Correlation with 6-12 week follow-up pelvic ultrasound is recommended. Electronically Signed   By: Virgle Grime M.D.   On: 07/19/2023 19:52     Procedures   Medications Ordered in the ED  sodium chloride  0.9 % bolus 1,000 mL (1,000 mLs Intravenous New Bag/Given 07/19/23 1856)  metoCLOPramide (REGLAN) injection 10 mg (10 mg Intravenous Given 07/19/23 1853)  potassium chloride SA (KLOR-CON M) CR tablet 40 mEq (40 mEq Oral Given 07/19/23 2010)  Medical Decision Making Amount and/or Complexity of Data Reviewed Labs: ordered. Radiology: ordered.  Risk Prescription drug management.   This patient is a 33 y.o. female who presents to the ED for concern of abdominal pain, nausea, vomiting, this involves an extensive number of treatment options, and is a complaint that carries with it a high risk of complications and morbidity. The emergent differential diagnosis prior to evaluation includes, but is not limited to, hyperemesis gravidarum, PID, appendicitis, kidney stone, septic abortion, ruptured ovarian cyst, ovarian torsion, tubo-ovarian abscess, fibroids, endometriosis, diverticulitis, cystitis, ectopic pregnancy, dysmenorrhea, septic abortion  This is not an exhaustive differential.   Past Medical History / Co-morbidities / Social History:  has a past medical history of Rh negative state in antepartum period (03/06/2021) and Tibia/fibula fracture, shaft, left, closed, initial encounter (12/15/2020).  Additional history: Chart reviewed. Pertinent results include: seen on 5/27 had positive pregnancy test, TVUS did not obvious fetal activity  Physical Exam: Physical exam performed. The pertinent findings include: generalized abdominal TTP without rebound or guarding.  Lab Tests: I ordered, and personally interpreted labs.  The pertinent results include:  K 3.2, hcg 184,415.  UA with ketones, protein, large leukocytes, 11-20 bacteria concerning for infection   Imaging Studies: I ordered imaging studies including pelvic US . I independently visualized and interpreted imaging which showed   1. Single, viable intrauterine pregnancy at approximately 8 weeks and 2 days gestation by ultrasound evaluation. 2. Findings which may represent a resolving right corpus luteum cyst. Correlation with 6-12 week follow-up pelvic ultrasound is recommended.  I agree with the radiologist interpretation.  Medications: I ordered medication including phenergan , fluids  for nausea, dehydration. Reevaluation of the patient after these medicines showed that the patient improved. I have reviewed the patients home medicines and have made adjustments as needed.  Disposition: After consideration of the diagnostic results and the patients response to treatment, I feel that emergency department workup does not suggest an emergent condition requiring admission or immediate intervention beyond what has been performed at this time. The plan is: Discharge with scopolamine  for nausea and vomiting per her request with Keflex with concern for UTI in pregnancy.  Recommend close OB/GYN follow-up and return precautions given.  After above interventions, patient is able to eat applesauce and drink fluids without any residual nausea or vomiting.  She feels well to go home. Evaluation and diagnostic testing in the emergency department does not suggest an emergent condition requiring admission or immediate intervention beyond what has been performed at this time.  Plan for discharge with close PCP follow-up.  Patient is understanding and amenable with plan, educated on red flag symptoms that would prompt immediate return.  Patient discharged in stable condition.  Final diagnoses:  Nausea and vomiting in pregnancy  [redacted] weeks gestation of pregnancy  Acute cystitis without hematuria  Cyst of right ovary  Hypokalemia     ED Discharge Orders          Ordered    scopolamine  (TRANSDERM-SCOP) 1 MG/3DAYS  every 72 hours        07/19/23 2159    cephALEXin (KEFLEX) 500 MG capsule  2 times daily        07/19/23 2159          An After Visit Summary was printed and given to the patient.      Fredna Jasper 07/19/23 2217    Lind Repine, MD 07/20/23 1606

## 2023-07-19 NOTE — ED Notes (Signed)
 Pt to US  at [employer]

## 2023-07-28 ENCOUNTER — Inpatient Hospital Stay (HOSPITAL_COMMUNITY)
Admission: AD | Admit: 2023-07-28 | Discharge: 2023-07-30 | DRG: 833 | Disposition: A | Discharge status: Home / Self Care | Attending: Family Medicine | Admitting: Family Medicine

## 2023-07-28 ENCOUNTER — Telehealth: Payer: Self-pay

## 2023-07-28 ENCOUNTER — Encounter (HOSPITAL_COMMUNITY): Payer: Self-pay | Admitting: Family Medicine

## 2023-07-28 ENCOUNTER — Other Ambulatory Visit: Payer: Self-pay

## 2023-07-28 DIAGNOSIS — O26891 Other specified pregnancy related conditions, first trimester: Secondary | ICD-10-CM | POA: Diagnosis not present

## 2023-07-28 DIAGNOSIS — R1110 Vomiting, unspecified: Secondary | ICD-10-CM | POA: Diagnosis not present

## 2023-07-28 DIAGNOSIS — E876 Hypokalemia: Secondary | ICD-10-CM | POA: Diagnosis present

## 2023-07-28 DIAGNOSIS — O211 Hyperemesis gravidarum with metabolic disturbance: Secondary | ICD-10-CM | POA: Diagnosis not present

## 2023-07-28 DIAGNOSIS — Z603 Acculturation difficulty: Secondary | ICD-10-CM | POA: Diagnosis present

## 2023-07-28 DIAGNOSIS — O99611 Diseases of the digestive system complicating pregnancy, first trimester: Secondary | ICD-10-CM | POA: Diagnosis not present

## 2023-07-28 DIAGNOSIS — Z3A08 8 weeks gestation of pregnancy: Secondary | ICD-10-CM | POA: Diagnosis not present

## 2023-07-28 DIAGNOSIS — Z3A09 9 weeks gestation of pregnancy: Secondary | ICD-10-CM | POA: Diagnosis not present

## 2023-07-28 DIAGNOSIS — Z758 Other problems related to medical facilities and other health care: Secondary | ICD-10-CM | POA: Diagnosis present

## 2023-07-28 DIAGNOSIS — K117 Disturbances of salivary secretion: Secondary | ICD-10-CM | POA: Diagnosis present

## 2023-07-28 DIAGNOSIS — O210 Mild hyperemesis gravidarum: Secondary | ICD-10-CM

## 2023-07-28 LAB — URINALYSIS, ROUTINE W REFLEX MICROSCOPIC
Bilirubin Urine: NEGATIVE
Glucose, UA: NEGATIVE mg/dL
Ketones, ur: 80 mg/dL — AB
Nitrite: NEGATIVE
Protein, ur: 100 mg/dL — AB
RBC / HPF: >50 RBC/hpf (ref 0–5)
Specific Gravity, Urine: 1.028 (ref 1.005–1.030)
pH: 5 (ref 5.0–8.0)

## 2023-07-28 LAB — COMPREHENSIVE METABOLIC PANEL WITH GFR
ALT: 15 U/L (ref 0–44)
AST: 16 U/L (ref 15–41)
Albumin: 3.7 g/dL (ref 3.5–5.0)
Alkaline Phosphatase: 40 U/L (ref 38–126)
Anion gap: 14 (ref 5–15)
BUN: 12 mg/dL (ref 6–20)
CO2: 19 mmol/L — ABNORMAL LOW (ref 22–32)
Calcium: 9.7 mg/dL (ref 8.9–10.3)
Chloride: 105 mmol/L (ref 98–111)
Creatinine, Ser: 0.83 mg/dL (ref 0.44–1.00)
GFR, Estimated: >60 mL/min (ref >60)
Glucose, Bld: 96 mg/dL (ref 70–99)
Potassium: 3.3 mmol/L — ABNORMAL LOW (ref 3.5–5.1)
Sodium: 138 mmol/L (ref 135–145)
Total Bilirubin: 1.1 mg/dL (ref 0.0–1.2)
Total Protein: 7.9 g/dL (ref 6.5–8.1)

## 2023-07-28 LAB — LIPASE, BLOOD: Lipase: 72 U/L — ABNORMAL HIGH (ref 11–51)

## 2023-07-28 MED ORDER — ONDANSETRON 4 MG PO TBDP: 4.0000 mg | ORAL_TABLET | Freq: Four times a day (QID) | ORAL | 0 refills | Status: DC | PRN

## 2023-07-28 MED ORDER — LACTATED RINGERS IV SOLN: INTRAVENOUS | Status: DC

## 2023-07-28 MED ORDER — FAMOTIDINE IN NACL 20-0.9 MG/50ML-% IV SOLN
20.0000 mg | Freq: Two times a day (BID) | INTRAVENOUS | Status: DC
  Administered 2023-07-28: 20 mg via INTRAVENOUS
  Filled 2023-07-28 (×2): qty 50

## 2023-07-28 MED ORDER — PANTOPRAZOLE SODIUM 40 MG IV SOLR
40.0000 mg | Freq: Once | INTRAVENOUS | Status: AC
  Administered 2023-07-28: 40 mg via INTRAVENOUS
  Filled 2023-07-28: qty 10

## 2023-07-28 MED ORDER — METHYLPREDNISOLONE 4 MG PO TABS: 4.0000 mg | ORAL_TABLET | Freq: Every day | ORAL | Status: DC

## 2023-07-28 MED ORDER — PROMETHAZINE HCL 25 MG PO TABS: 25.0000 mg | ORAL_TABLET | Freq: Four times a day (QID) | ORAL | 2 refills | Status: DC | PRN

## 2023-07-28 MED ORDER — METHYLPREDNISOLONE 4 MG PO TABS: 8.0000 mg | ORAL_TABLET | Freq: Every day | ORAL | Status: DC

## 2023-07-28 MED ORDER — POTASSIUM CHLORIDE 10 MEQ/100ML IV SOLN
10.0000 meq | INTRAVENOUS | Status: AC
  Administered 2023-07-28 – 2023-07-29 (×4): 10 meq via INTRAVENOUS
  Filled 2023-07-28 (×4): qty 100

## 2023-07-28 MED ORDER — ONDANSETRON HCL 4 MG/2ML IJ SOLN
4.0000 mg | Freq: Three times a day (TID) | INTRAMUSCULAR | Status: DC | PRN
  Administered 2023-07-28: 4 mg via INTRAVENOUS
  Filled 2023-07-28: qty 2

## 2023-07-28 MED ORDER — SUCRALFATE 1 GM/10ML PO SUSP
1.0000 g | Freq: Three times a day (TID) | ORAL | Status: DC
  Filled 2023-07-28 (×2): qty 10

## 2023-07-28 MED ORDER — GLYCOPYRROLATE 1 MG PO TABS: 2.0000 mg | ORAL_TABLET | Freq: Three times a day (TID) | ORAL | 0 refills | Status: DC | PRN

## 2023-07-28 MED ORDER — THIAMINE HCL 100 MG/ML IJ SOLN: 100.0000 mg | Freq: Every day | INTRAMUSCULAR | Status: DC

## 2023-07-28 MED ORDER — GLYCOPYRROLATE 1 MG PO TABS
2.0000 mg | ORAL_TABLET | Freq: Three times a day (TID) | ORAL | Status: DC | PRN
  Administered 2023-07-28: 2 mg via ORAL
  Filled 2023-07-28: qty 2

## 2023-07-28 MED ORDER — SODIUM CHLORIDE 0.9 % IV SOLN: 25.0000 mg | Freq: Once | INTRAVENOUS | Status: DC

## 2023-07-28 MED ORDER — THIAMINE MONONITRATE 100 MG PO TABS
100.0000 mg | ORAL_TABLET | Freq: Every day | ORAL | Status: DC
  Filled 2023-07-28: qty 1

## 2023-07-28 MED ORDER — PROMETHAZINE HCL 25 MG RE SUPP: 12.5000 mg | RECTAL | Status: DC | PRN

## 2023-07-28 MED ORDER — LACTATED RINGERS IV SOLN: INTRAVENOUS | Status: AC

## 2023-07-28 MED ORDER — METHYLPREDNISOLONE 16 MG PO TABS
16.0000 mg | ORAL_TABLET | Freq: Every day | ORAL | Status: AC
  Administered 2023-07-29 – 2023-07-30 (×2): 16 mg via ORAL
  Filled 2023-07-28 (×3): qty 1

## 2023-07-28 MED ORDER — PROMETHAZINE HCL 25 MG PO TABS: 12.5000 mg | ORAL_TABLET | ORAL | Status: DC | PRN

## 2023-07-28 MED ORDER — THIAMINE HCL 100 MG/ML IJ SOLN
100.0000 mg | Freq: Every day | INTRAMUSCULAR | Status: DC
  Administered 2023-07-28: 100 mg via INTRAVENOUS
  Filled 2023-07-28: qty 1

## 2023-07-28 MED ORDER — CALCIUM CARBONATE ANTACID 500 MG PO CHEW: 2.0000 | CHEWABLE_TABLET | ORAL | Status: DC | PRN

## 2023-07-28 MED ORDER — DICLEGIS 10-10 MG PO TBEC: 1.0000 | DELAYED_RELEASE_TABLET | Freq: Every evening | ORAL | 5 refills | Status: DC

## 2023-07-28 MED ORDER — METHYLPREDNISOLONE SODIUM SUCC 125 MG IJ SOLR
48.0000 mg | Freq: Once | INTRAMUSCULAR | Status: AC
  Administered 2023-07-28: 48 mg via INTRAVENOUS
  Filled 2023-07-28: qty 2

## 2023-07-28 MED ORDER — DOXYLAMINE SUCCINATE (SLEEP) 25 MG PO TABS
25.0000 mg | ORAL_TABLET | Freq: Two times a day (BID) | ORAL | Status: DC
Start: 2023-07-28 — End: 2023-07-30
  Administered 2023-07-28 – 2023-07-30 (×4): 25 mg via ORAL
  Filled 2023-07-28 (×4): qty 1

## 2023-07-28 MED ORDER — METHYLPREDNISOLONE 16 MG PO TABS
16.0000 mg | ORAL_TABLET | Freq: Every day | ORAL | Status: DC
  Administered 2023-07-29: 16 mg via ORAL
  Filled 2023-07-28 (×2): qty 1

## 2023-07-28 MED ORDER — VITAMIN B-6 25 MG PO TABS
25.0000 mg | ORAL_TABLET | Freq: Two times a day (BID) | ORAL | Status: DC
  Administered 2023-07-28 – 2023-07-30 (×4): 25 mg via ORAL
  Filled 2023-07-28 (×4): qty 1

## 2023-07-28 MED ORDER — LACTATED RINGERS IV BOLUS
1000.0000 mL | Freq: Once | INTRAVENOUS | Status: AC
  Administered 2023-07-28: 1000 mL via INTRAVENOUS

## 2023-07-28 MED ORDER — FAMOTIDINE 20 MG PO TABS
20.0000 mg | ORAL_TABLET | Freq: Two times a day (BID) | ORAL | Status: DC
  Administered 2023-07-29 – 2023-07-30 (×3): 20 mg via ORAL
  Filled 2023-07-28 (×4): qty 1

## 2023-07-28 MED ORDER — ONDANSETRON 4 MG PO TBDP: 4.0000 mg | ORAL_TABLET | Freq: Three times a day (TID) | ORAL | Status: DC | PRN

## 2023-07-28 MED ORDER — SODIUM CHLORIDE 0.9 % IV SOLN
25.0000 mg | Freq: Once | INTRAVENOUS | Status: AC
  Administered 2023-07-28: 25 mg via INTRAVENOUS
  Filled 2023-07-28: qty 1

## 2023-07-28 MED ORDER — METHYLPREDNISOLONE 16 MG PO TABS
16.0000 mg | ORAL_TABLET | Freq: Every day | ORAL | Status: DC
  Administered 2023-07-29 – 2023-07-30 (×2): 16 mg via ORAL
  Filled 2023-07-28 (×2): qty 1

## 2023-07-28 MED ORDER — ONDANSETRON HCL 4 MG/2ML IJ SOLN
4.0000 mg | Freq: Once | INTRAMUSCULAR | Status: AC
  Administered 2023-07-28: 4 mg via INTRAVENOUS
  Filled 2023-07-28: qty 2

## 2023-07-28 NOTE — MAU Note (Cosign Needed)
 Chief Complaint: Emesis and Nausea  SUBJECTIVE HPI: Pamela Santos is a 33 y.o. G2P1001 at [redacted]w[redacted]d by early ultrasound who presents to maternity admissions reporting unable to eat and drink for 3 weeks, despite scopolamine  patches. Also spitting constantly. Burning epigastric pain. Similar happen in her first pregnancy until around 16 weeks, then stopped. Scopolamine  was helpful in prior pregnancy but not with this one, thus stopped using.   She denies vaginal bleeding, vaginal itching/burning, urinary symptoms, h/a, dizziness, or fever/chills.    HPI  Past Medical History:  Diagnosis Date   Rh negative state in antepartum period 03/06/2021   Tibia/fibula fracture, shaft, left, closed, initial encounter 12/15/2020   Added automatically from request for surgery [id number]   Past Surgical History:  Procedure Laterality Date   TIBIA IM NAIL INSERTION Left 12/16/2020   Procedure: INTRAMEDULLARY (IM) NAIL TIBIAL;  Surgeon: Kendal Franky SQUIBB, MD;  Location: [facility] OR;  Service: Orthopedics;  Laterality: Left;   Social History   Socioeconomic History   Marital status: Married    Spouse name: Not on file   Number of children: Not on file   Years of education: Not on file   Highest education level: Not on file  Occupational History   Not on file  Tobacco Use   Smoking status: Never   Smokeless tobacco: Never  Vaping Use   Vaping status: Never Used  Substance and Sexual Activity   Alcohol use: Not Currently   Drug use: Never   Sexual activity: Yes    Birth control/protection: Condom  Other Topics Concern   Not on file  Social History Narrative   Not on file   Social Drivers of Health   Financial Resource Strain: Not on file  Food Insecurity: Food Insecurity Present (10/20/2022)   Hunger Vital Sign    Worried About Running Out of Food in the Last Year: Sometimes true    Ran Out of Food in the Last Year: Never true  Transportation Needs: No Transportation Needs (10/20/2022)    PRAPARE - Administrator, Civil Service (Medical): No    Lack of Transportation (Non-Medical): No  Physical Activity: Not on file  Stress: Not on file  Social Connections: Not on file  Intimate Partner Violence: Not on file   No current facility-administered medications on file prior to encounter.   Current Outpatient Medications on File Prior to Encounter  Medication Sig Dispense Refill   acetaminophen  (TYLENOL ) 325 MG tablet Take 2 tablets (650 mg total) by mouth every 4 (four) hours as needed (for pain scale < 4). (Patient not taking: Reported on 10/20/2022) 50 tablet 0   ibuprofen  (ADVIL ) 600 MG tablet Take 1 tablet (600 mg total) by mouth every 6 (six) hours. (Patient not taking: Reported on 10/20/2022) 30 tablet 0   melatonin 1 MG TABS tablet Take 1 tablet (1 mg total) by mouth at bedtime. (Patient not taking: Reported on 10/20/2022) 30 tablet 0   psyllium (METAMUCIL SMOOTH TEXTURE) 58.6 % powder Take 1 packet by mouth 2 (two) times daily. (Patient not taking: Reported on 10/20/2022) 283 g 12   scopolamine  (TRANSDERM-SCOP) 1 MG/3DAYS Place 1 patch (1.5 mg total) onto the skin every 3 (three) days. 10 patch 12   No Known Allergies  ROS:  Pertinent positives/negatives listed above.  I have reviewed patient's Past Medical Hx, Surgical Hx, Family Hx, Social Hx, medications and allergies.   Physical Exam  Patient Vitals for the past 24 hrs:  BP Temp Pulse  Resp SpO2 Height Weight  07/28/23 1814 105/68 -- 92 15 -- -- --  07/28/23 1347 104/71 -- 87 16 -- -- --  07/28/23 1345 -- -- -- -- 99 % -- --  07/28/23 1326 111/76 -- 99 16 -- -- --  07/28/23 1325 -- -- -- -- 99 % -- --  07/28/23 1255 104/80 97.9 F (36.6 C) (!) 119 18 -- 5' 3 (1.6 m) 63.5 kg   Constitutional: Well-developed, well-nourished female in no acute distress.  Cardiovascular: normal rate Respiratory: normal effort GI: Abd soft, non-tender. Pos BS x 4 MS: Extremities nontender, no edema, normal  ROM Neurologic: Alert and oriented x 4.  GU: Neg CVAT.  LAB RESULTS Results for orders placed or performed during the hospital encounter of 07/28/23 (from the past 24 hours)  Urinalysis, Routine w reflex microscopic -Urine, Clean Catch     Status: Abnormal   Collection Time: 07/28/23  1:24 PM  Result Value Ref Range   Color, Urine AMBER (A) YELLOW   APPearance CLOUDY (A) CLEAR   Specific Gravity, Urine 1.028 1.005 - 1.030   pH 5.0 5.0 - 8.0   Glucose, UA NEGATIVE NEGATIVE mg/dL   Hgb urine dipstick MODERATE (A) NEGATIVE   Bilirubin Urine NEGATIVE NEGATIVE   Ketones, ur 80 (A) NEGATIVE mg/dL   Protein, ur 899 (A) NEGATIVE mg/dL   Nitrite NEGATIVE NEGATIVE   Leukocytes,Ua SMALL (A) NEGATIVE   RBC / HPF >50 0 - 5 RBC/hpf   WBC, UA 11-20 0 - 5 WBC/hpf   Bacteria, UA RARE (A) NONE SEEN   Squamous Epithelial / HPF 21-50 0 - 5 /HPF   Mucus PRESENT    Hyaline Casts, UA PRESENT   Comprehensive metabolic panel     Status: Abnormal   Collection Time: 07/28/23  2:58 PM  Result Value Ref Range   Sodium 138 135 - 145 mmol/L   Potassium 3.3 (L) 3.5 - 5.1 mmol/L   Chloride 105 98 - 111 mmol/L   CO2 19 (L) 22 - 32 mmol/L   Glucose, Bld 96 70 - 99 mg/dL   BUN 12 6 - 20 mg/dL   Creatinine, Ser 9.16 0.44 - 1.00 mg/dL   Calcium 9.7 8.9 - 89.6 mg/dL   Total Protein 7.9 6.5 - 8.1 g/dL   Albumin 3.7 3.5 - 5.0 g/dL   AST 16 15 - 41 U/L   ALT 15 0 - 44 U/L   Alkaline Phosphatase 40 38 - 126 U/L   Total Bilirubin 1.1 0.0 - 1.2 mg/dL   GFR, Estimated >39 >39 mL/min   Anion gap 14 5 - 15  Lipase, blood     Status: Abnormal   Collection Time: 07/28/23  2:58 PM  Result Value Ref Range   Lipase 72 (H) 11 - 51 U/L       IMAGING US  OB Comp < 14 Wks Result Date: 07/19/2023 CLINICAL DATA:  Abdominal pain and vomiting. EXAM: OBSTETRIC <14 WK ULTRASOUND TECHNIQUE: Transabdominal ultrasound was performed for evaluation of the gestation as well as the maternal uterus and adnexal regions. COMPARISON:   Jun 27, 2023 FINDINGS: Intrauterine gestational sac: Single Yolk sac:  Visualized. Embryo:  Visualized. Cardiac Activity: Visualized. Heart Rate: 175 bpm CRL:   17.7 mm   8 w 2 d                  US  EDC: February 26, 2024 Subchorionic hemorrhage:  Small (1.1 cm x 1.3 cm x 0.7 cm Maternal uterus/adnexae: The  right ovary is visualized and contains a 1.4 cm x 1.2 cm x 1.4 cm cystic appearing area. The left ovary is visualized and is unremarkable. No pelvic free fluid is seen. IMPRESSION: 1. Single, viable intrauterine pregnancy at approximately 8 weeks and 2 days gestation by ultrasound evaluation. 2. Findings which may represent a resolving right corpus luteum cyst. Correlation with 6-12 week follow-up pelvic ultrasound is recommended. Electronically Signed   By: Suzen Dials M.D.   On: 07/19/2023 19:52   US  OB Transvaginal Result Date: 07/19/2023 CLINICAL DATA:  Abdominal pain and vomiting. EXAM: OBSTETRIC <14 WK ULTRASOUND TECHNIQUE: Transabdominal ultrasound was performed for evaluation of the gestation as well as the maternal uterus and adnexal regions. COMPARISON:  Jun 27, 2023 FINDINGS: Intrauterine gestational sac: Single Yolk sac:  Visualized. Embryo:  Visualized. Cardiac Activity: Visualized. Heart Rate: 175 bpm CRL:   17.7 mm   8 w 2 d                  US  EDC: February 26, 2024 Subchorionic hemorrhage:  Small (1.1 cm x 1.3 cm x 0.7 cm Maternal uterus/adnexae: The right ovary is visualized and contains a 1.4 cm x 1.2 cm x 1.4 cm cystic appearing area. The left ovary is visualized and is unremarkable. No pelvic free fluid is seen. IMPRESSION: 1. Single, viable intrauterine pregnancy at approximately 8 weeks and 2 days gestation by ultrasound evaluation. 2. Findings which may represent a resolving right corpus luteum cyst. Correlation with 6-12 week follow-up pelvic ultrasound is recommended. Electronically Signed   By: Suzen Dials M.D.   On: 07/19/2023 19:52    MAU Management/MDM: Orders Placed  This Encounter  Procedures   Urinalysis, Routine w reflex microscopic -Urine, Clean Catch   Comprehensive metabolic panel   Lipase, blood   Diet NPO time specified   Assess fetal heart tones by doppler   Activity as tolerated   Apply Hyperemesis Care Plan    Meds ordered this encounter  Medications   DISCONTD: sucralfate (CARAFATE) 1 GM/10ML suspension 1 g   lactated ringers  bolus 1,000 mL   ondansetron  (ZOFRAN ) injection 4 mg   pantoprazole (PROTONIX) injection 40 mg   glycopyrrolate (ROBINUL) tablet 2 mg   DISCONTD: promethazine  (PHENERGAN ) 25 mg in sodium chloride  0.9 % 1,000 mL infusion   promethazine  (PHENERGAN ) 25 mg in sodium chloride  0.9 % 500 mL infusion   DISCONTD: thiamine (VITAMIN B1) injection 100 mg   OR Linked Order Group    thiamine (VITAMIN B1) tablet 100 mg    thiamine (VITAMIN B1) injection 100 mg   methylPREDNISolone sodium succinate (SOLU-MEDROL) 125 mg/2 mL injection 48 mg    IV methylprednisolone will be converted to either a q12h or q24h frequency with the same total daily dose (TDD).  Ordered Dose: 1 to 125 mg TDD; convert to: TDD q24h.  Ordered Dose: 126 to 250 mg TDD; convert to: TDD div q12h.  Ordered Dose: >250 mg TDD; DAW.   FOLLOWED BY Linked Order Group    methylPREDNISolone (MEDROL) tablet 16 mg    methylPREDNISolone (MEDROL) tablet 8 mg    methylPREDNISolone (MEDROL) tablet 4 mg   FOLLOWED BY Linked Order Group    methylPREDNISolone (MEDROL) tablet 16 mg    methylPREDNISolone (MEDROL) tablet 8 mg    methylPREDNISolone (MEDROL) tablet 4 mg   FOLLOWED BY Linked Order Group    methylPREDNISolone (MEDROL) tablet 16 mg    methylPREDNISolone (MEDROL) tablet 8 mg    methylPREDNISolone (MEDROL) tablet 4  mg   glycopyrrolate (ROBINUL) 1 MG tablet    Sig: Take 2 tablets (2 mg total) by mouth 3 (three) times daily as needed for up to 30 doses (spitting).    Dispense:  30 tablet    Refill:  0   promethazine  (PHENERGAN ) 25 MG tablet    Sig: Take 1  tablet (25 mg total) by mouth every 6 (six) hours as needed for nausea or vomiting.    Dispense:  30 tablet    Refill:  2   ondansetron  (ZOFRAN -ODT) 4 MG disintegrating tablet    Sig: Take 1 tablet (4 mg total) by mouth every 6 (six) hours as needed for nausea.    Dispense:  20 tablet    Refill:  0   Doxylamine -Pyridoxine (DICLEGIS) 10-10 MG TBEC    Sig: Take 1 tablet by mouth at bedtime.    Dispense:  60 tablet    Refill:  5   lactated ringers  infusion    ASSESSMENT 1. [redacted] weeks gestation of pregnancy   2. Hyperemesis gravidarum   Unrelieved with 1L LR bolus, Zofran , Protonix; still unable to tolerate PO.  Will trial phenergan  and robinul - improved symptoms, tolerated PO Robinul  Will fluid challenge - failed.  PLAN Will given thiamin, methylPREDNISolone and admit for continued IV therapy while steroids take effect and PO regime can be developed.  Mardy Shropshire, MD FMOB Fellow, Faculty practice [facility], [facility] 07/28/2023  6:51 PM

## 2023-07-28 NOTE — H&P (Signed)
 Chief Complaint: Emesis and Nausea  SUBJECTIVE HPI: Pamela Santos is a 33 y.o. G2P1001 at [redacted]w[redacted]d by early ultrasound who presents to maternity admissions reporting unable to eat and drink for 3 weeks, despite scopolamine  patches. Also spitting constantly. Burning epigastric pain. Similar happen in her first pregnancy until around 16 weeks, then stopped. Scopolamine  was helpful in prior pregnancy but not with this one, thus stopped using.   She denies vaginal bleeding, vaginal itching/burning, urinary symptoms, h/a, dizziness, or fever/chills.    HPI  Past Medical History:  Diagnosis Date   Rh negative state in antepartum period 03/06/2021   Tibia/fibula fracture, shaft, left, closed, initial encounter 12/15/2020   Added automatically from request for surgery [id number]   Past Surgical History:  Procedure Laterality Date   TIBIA IM NAIL INSERTION Left 12/16/2020   Procedure: INTRAMEDULLARY (IM) NAIL TIBIAL;  Surgeon: Kendal Franky SQUIBB, MD;  Location: [facility] OR;  Service: Orthopedics;  Laterality: Left;   Social History   Socioeconomic History   Marital status: Married    Spouse name: Not on file   Number of children: Not on file   Years of education: Not on file   Highest education level: Not on file  Occupational History   Not on file  Tobacco Use   Smoking status: Never   Smokeless tobacco: Never  Vaping Use   Vaping status: Never Used  Substance and Sexual Activity   Alcohol use: Not Currently   Drug use: Never   Sexual activity: Yes    Birth control/protection: Condom  Other Topics Concern   Not on file  Social History Narrative   Not on file   Social Drivers of Health   Financial Resource Strain: Not on file  Food Insecurity: Food Insecurity Present (10/20/2022)   Hunger Vital Sign    Worried About Running Out of Food in the Last Year: Sometimes true    Ran Out of Food in the Last Year: Never true  Transportation Needs: No Transportation Needs (10/20/2022)    PRAPARE - Administrator, Civil Service (Medical): No    Lack of Transportation (Non-Medical): No  Physical Activity: Not on file  Stress: Not on file  Social Connections: Not on file  Intimate Partner Violence: Not on file   No current facility-administered medications on file prior to encounter.   Current Outpatient Medications on File Prior to Encounter  Medication Sig Dispense Refill   acetaminophen  (TYLENOL ) 325 MG tablet Take 2 tablets (650 mg total) by mouth every 4 (four) hours as needed (for pain scale < 4). (Patient not taking: Reported on 10/20/2022) 50 tablet 0   ibuprofen  (ADVIL ) 600 MG tablet Take 1 tablet (600 mg total) by mouth every 6 (six) hours. (Patient not taking: Reported on 10/20/2022) 30 tablet 0   melatonin 1 MG TABS tablet Take 1 tablet (1 mg total) by mouth at bedtime. (Patient not taking: Reported on 10/20/2022) 30 tablet 0   psyllium (METAMUCIL SMOOTH TEXTURE) 58.6 % powder Take 1 packet by mouth 2 (two) times daily. (Patient not taking: Reported on 10/20/2022) 283 g 12   scopolamine  (TRANSDERM-SCOP) 1 MG/3DAYS Place 1 patch (1.5 mg total) onto the skin every 3 (three) days. 10 patch 12   No Known Allergies  ROS:  Pertinent positives/negatives listed above.  I have reviewed patient's Past Medical Hx, Surgical Hx, Family Hx, Social Hx, medications and allergies.   Physical Exam  Patient Vitals for the past 24 hrs:  BP Temp Pulse  Resp SpO2 Height Weight  07/28/23 1814 105/68 -- 92 15 -- -- --  07/28/23 1347 104/71 -- 87 16 -- -- --  07/28/23 1345 -- -- -- -- 99 % -- --  07/28/23 1326 111/76 -- 99 16 -- -- --  07/28/23 1325 -- -- -- -- 99 % -- --  07/28/23 1255 104/80 97.9 F (36.6 C) (!) 119 18 -- 5' 3 (1.6 m) 63.5 kg   Constitutional: Well-developed, well-nourished female in no acute distress.  Cardiovascular: normal rate Respiratory: normal effort GI: Abd soft, non-tender. Pos BS x 4 MS: Extremities nontender, no edema, normal  ROM Neurologic: Alert and oriented x 4.  GU: Neg CVAT.  LAB RESULTS Results for orders placed or performed during the hospital encounter of 07/28/23 (from the past 24 hours)  Urinalysis, Routine w reflex microscopic -Urine, Clean Catch     Status: Abnormal   Collection Time: 07/28/23  1:24 PM  Result Value Ref Range   Color, Urine AMBER (A) YELLOW   APPearance CLOUDY (A) CLEAR   Specific Gravity, Urine 1.028 1.005 - 1.030   pH 5.0 5.0 - 8.0   Glucose, UA NEGATIVE NEGATIVE mg/dL   Hgb urine dipstick MODERATE (A) NEGATIVE   Bilirubin Urine NEGATIVE NEGATIVE   Ketones, ur 80 (A) NEGATIVE mg/dL   Protein, ur 899 (A) NEGATIVE mg/dL   Nitrite NEGATIVE NEGATIVE   Leukocytes,Ua SMALL (A) NEGATIVE   RBC / HPF >50 0 - 5 RBC/hpf   WBC, UA 11-20 0 - 5 WBC/hpf   Bacteria, UA RARE (A) NONE SEEN   Squamous Epithelial / HPF 21-50 0 - 5 /HPF   Mucus PRESENT    Hyaline Casts, UA PRESENT   Comprehensive metabolic panel     Status: Abnormal   Collection Time: 07/28/23  2:58 PM  Result Value Ref Range   Sodium 138 135 - 145 mmol/L   Potassium 3.3 (L) 3.5 - 5.1 mmol/L   Chloride 105 98 - 111 mmol/L   CO2 19 (L) 22 - 32 mmol/L   Glucose, Bld 96 70 - 99 mg/dL   BUN 12 6 - 20 mg/dL   Creatinine, Ser 9.16 0.44 - 1.00 mg/dL   Calcium 9.7 8.9 - 89.6 mg/dL   Total Protein 7.9 6.5 - 8.1 g/dL   Albumin 3.7 3.5 - 5.0 g/dL   AST 16 15 - 41 U/L   ALT 15 0 - 44 U/L   Alkaline Phosphatase 40 38 - 126 U/L   Total Bilirubin 1.1 0.0 - 1.2 mg/dL   GFR, Estimated >39 >39 mL/min   Anion gap 14 5 - 15  Lipase, blood     Status: Abnormal   Collection Time: 07/28/23  2:58 PM  Result Value Ref Range   Lipase 72 (H) 11 - 51 U/L       IMAGING US  OB Comp < 14 Wks Result Date: 07/19/2023 CLINICAL DATA:  Abdominal pain and vomiting. EXAM: OBSTETRIC <14 WK ULTRASOUND TECHNIQUE: Transabdominal ultrasound was performed for evaluation of the gestation as well as the maternal uterus and adnexal regions. COMPARISON:   Jun 27, 2023 FINDINGS: Intrauterine gestational sac: Single Yolk sac:  Visualized. Embryo:  Visualized. Cardiac Activity: Visualized. Heart Rate: 175 bpm CRL:   17.7 mm   8 w 2 d                  US  EDC: February 26, 2024 Subchorionic hemorrhage:  Small (1.1 cm x 1.3 cm x 0.7 cm Maternal uterus/adnexae: The  right ovary is visualized and contains a 1.4 cm x 1.2 cm x 1.4 cm cystic appearing area. The left ovary is visualized and is unremarkable. No pelvic free fluid is seen. IMPRESSION: 1. Single, viable intrauterine pregnancy at approximately 8 weeks and 2 days gestation by ultrasound evaluation. 2. Findings which may represent a resolving right corpus luteum cyst. Correlation with 6-12 week follow-up pelvic ultrasound is recommended. Electronically Signed   By: Suzen Dials M.D.   On: 07/19/2023 19:52   US  OB Transvaginal Result Date: 07/19/2023 CLINICAL DATA:  Abdominal pain and vomiting. EXAM: OBSTETRIC <14 WK ULTRASOUND TECHNIQUE: Transabdominal ultrasound was performed for evaluation of the gestation as well as the maternal uterus and adnexal regions. COMPARISON:  Jun 27, 2023 FINDINGS: Intrauterine gestational sac: Single Yolk sac:  Visualized. Embryo:  Visualized. Cardiac Activity: Visualized. Heart Rate: 175 bpm CRL:   17.7 mm   8 w 2 d                  US  EDC: February 26, 2024 Subchorionic hemorrhage:  Small (1.1 cm x 1.3 cm x 0.7 cm Maternal uterus/adnexae: The right ovary is visualized and contains a 1.4 cm x 1.2 cm x 1.4 cm cystic appearing area. The left ovary is visualized and is unremarkable. No pelvic free fluid is seen. IMPRESSION: 1. Single, viable intrauterine pregnancy at approximately 8 weeks and 2 days gestation by ultrasound evaluation. 2. Findings which may represent a resolving right corpus luteum cyst. Correlation with 6-12 week follow-up pelvic ultrasound is recommended. Electronically Signed   By: Suzen Dials M.D.   On: 07/19/2023 19:52    MAU Management/MDM: Orders Placed  This Encounter  Procedures   Urinalysis, Routine w reflex microscopic -Urine, Clean Catch   Comprehensive metabolic panel   Lipase, blood   Diet NPO time specified   Assess fetal heart tones by doppler   Activity as tolerated   Apply Hyperemesis Care Plan    Meds ordered this encounter  Medications   DISCONTD: sucralfate (CARAFATE) 1 GM/10ML suspension 1 g   lactated ringers  bolus 1,000 mL   ondansetron  (ZOFRAN ) injection 4 mg   pantoprazole (PROTONIX) injection 40 mg   glycopyrrolate (ROBINUL) tablet 2 mg   DISCONTD: promethazine  (PHENERGAN ) 25 mg in sodium chloride  0.9 % 1,000 mL infusion   promethazine  (PHENERGAN ) 25 mg in sodium chloride  0.9 % 500 mL infusion   DISCONTD: thiamine (VITAMIN B1) injection 100 mg   OR Linked Order Group    thiamine (VITAMIN B1) tablet 100 mg    thiamine (VITAMIN B1) injection 100 mg   methylPREDNISolone sodium succinate (SOLU-MEDROL) 125 mg/2 mL injection 48 mg    IV methylprednisolone will be converted to either a q12h or q24h frequency with the same total daily dose (TDD).  Ordered Dose: 1 to 125 mg TDD; convert to: TDD q24h.  Ordered Dose: 126 to 250 mg TDD; convert to: TDD div q12h.  Ordered Dose: >250 mg TDD; DAW.   FOLLOWED BY Linked Order Group    methylPREDNISolone (MEDROL) tablet 16 mg    methylPREDNISolone (MEDROL) tablet 8 mg    methylPREDNISolone (MEDROL) tablet 4 mg   FOLLOWED BY Linked Order Group    methylPREDNISolone (MEDROL) tablet 16 mg    methylPREDNISolone (MEDROL) tablet 8 mg    methylPREDNISolone (MEDROL) tablet 4 mg   FOLLOWED BY Linked Order Group    methylPREDNISolone (MEDROL) tablet 16 mg    methylPREDNISolone (MEDROL) tablet 8 mg    methylPREDNISolone (MEDROL) tablet 4  mg   glycopyrrolate (ROBINUL) 1 MG tablet    Sig: Take 2 tablets (2 mg total) by mouth 3 (three) times daily as needed for up to 30 doses (spitting).    Dispense:  30 tablet    Refill:  0   promethazine  (PHENERGAN ) 25 MG tablet    Sig: Take 1  tablet (25 mg total) by mouth every 6 (six) hours as needed for nausea or vomiting.    Dispense:  30 tablet    Refill:  2   ondansetron  (ZOFRAN -ODT) 4 MG disintegrating tablet    Sig: Take 1 tablet (4 mg total) by mouth every 6 (six) hours as needed for nausea.    Dispense:  20 tablet    Refill:  0   Doxylamine -Pyridoxine (DICLEGIS) 10-10 MG TBEC    Sig: Take 1 tablet by mouth at bedtime.    Dispense:  60 tablet    Refill:  5   lactated ringers  infusion    ASSESSMENT 1. [redacted] weeks gestation of pregnancy   2. Hyperemesis gravidarum   Unrelieved with 1L LR bolus, Zofran , Protonix; still unable to tolerate PO.  Will trial phenergan  and robinul - improved symptoms, tolerated PO Robinul  Will fluid challenge - failed.  PLAN Will given thiamin, methylPREDNISolone and admit for continued IV therapy while steroids take effect and PO regime can be developed.  Mardy Shropshire, MD FMOB Fellow, Faculty practice [facility] 07/28/2023  6:51 PM

## 2023-07-28 NOTE — MAU Note (Signed)
 PO challenge started, to hold meds at this time. No vomiting since phenergan  begun.

## 2023-07-28 NOTE — Telephone Encounter (Signed)
 Patient employer called regarding patient feeling weak.   CMA called patient back with a french interpreter [employer] assist with the call. Patient stated tried medication given to her at the Emergency room on 07/19/2023, does not help. She tried ginger candy and it is not helping. Patient stated she's vomiting and not able to keep any food down. Patient was advise to go to MAU to be evaluated for nausea and vomiting.   Ermalinda GRADE CMA

## 2023-07-28 NOTE — MAU Note (Addendum)
 Pamela Santos is a 33 y.o. at [redacted]w[redacted]d here in MAU reporting: has not been able to eat or drink for 3 weeks. Has had medication (scope patch only) for nausea but has not helped/ reports burning in her chest when she vomits.pt also spitting up sylvia constantly   5kg weigh loss since last week   LMP:  Onset of complaint: 3 weeks Pain score:  Vitals:   07/28/23 1255  BP: 104/80  Pulse: (!) 119  Resp: 18  Temp: 97.9 F (36.6 C)     FHT: n/a  Lab orders placed from triage: u/a

## 2023-07-28 NOTE — Plan of Care (Signed)
  Problem: Education: Goal: Knowledge of disease or condition will improve Outcome: Progressing Goal: Knowledge of the prescribed therapeutic regimen will improve Outcome: Progressing   Problem: Bowel/Gastric: Goal: Occurences of nausea and/or vomiting will decrease Outcome: Progressing   Problem: Fluid Volume: Goal: Maintenance of adequate hydration will improve Outcome: Progressing   Problem: Nutritional: Goal: Achievement of adequate weight for body size and type will improve Outcome: Progressing   Problem: Education: Goal: Knowledge of General Education information will improve Description: Including pain rating scale, medication(s)/side effects and non-pharmacologic comfort measures Outcome: Progressing   Problem: Health Behavior/Discharge Planning: Goal: Ability to manage health-related needs will improve Outcome: Progressing   Problem: Clinical Measurements: Goal: Ability to maintain clinical measurements within normal limits will improve Outcome: Progressing Goal: Will remain free from infection Outcome: Progressing Goal: Diagnostic test results will improve Outcome: Progressing Goal: Respiratory complications will improve Outcome: Progressing Goal: Cardiovascular complication will be avoided Outcome: Progressing   Problem: Activity: Goal: Risk for activity intolerance will decrease Outcome: Progressing   Problem: Nutrition: Goal: Adequate nutrition will be maintained Outcome: Progressing   Problem: Coping: Goal: Level of anxiety will decrease Outcome: Progressing   Problem: Elimination: Goal: Will not experience complications related to bowel motility Outcome: Progressing Goal: Will not experience complications related to urinary retention Outcome: Progressing   Problem: Pain Managment: Goal: General experience of comfort will improve and/or be controlled Outcome: Progressing   Problem: Safety: Goal: Ability to remain free from injury will  improve Outcome: Progressing   Problem: Skin Integrity: Goal: Risk for impaired skin integrity will decrease Outcome: Progressing

## 2023-07-29 DIAGNOSIS — R1110 Vomiting, unspecified: Secondary | ICD-10-CM | POA: Diagnosis not present

## 2023-07-29 DIAGNOSIS — O26891 Other specified pregnancy related conditions, first trimester: Secondary | ICD-10-CM

## 2023-07-29 DIAGNOSIS — K117 Disturbances of salivary secretion: Secondary | ICD-10-CM | POA: Diagnosis present

## 2023-07-29 DIAGNOSIS — Z3A08 8 weeks gestation of pregnancy: Secondary | ICD-10-CM | POA: Diagnosis not present

## 2023-07-29 DIAGNOSIS — E876 Hypokalemia: Secondary | ICD-10-CM | POA: Diagnosis present

## 2023-07-29 LAB — BASIC METABOLIC PANEL WITH GFR
Anion gap: 10 (ref 5–15)
BUN: 5 mg/dL — ABNORMAL LOW (ref 6–20)
CO2: 21 mmol/L — ABNORMAL LOW (ref 22–32)
Calcium: 9 mg/dL (ref 8.9–10.3)
Chloride: 104 mmol/L (ref 98–111)
Creatinine, Ser: 0.59 mg/dL (ref 0.44–1.00)
GFR, Estimated: >60 mL/min (ref >60)
Glucose, Bld: 101 mg/dL — ABNORMAL HIGH (ref 70–99)
Potassium: 3.4 mmol/L — ABNORMAL LOW (ref 3.5–5.1)
Sodium: 135 mmol/L (ref 135–145)

## 2023-07-29 LAB — CBC
HCT: 37.2 % (ref 36.0–46.0)
Hemoglobin: 12.7 g/dL (ref 12.0–15.0)
MCH: 29.3 pg (ref 26.0–34.0)
MCHC: 34.1 g/dL (ref 30.0–36.0)
MCV: 85.7 fL (ref 80.0–100.0)
Platelets: 250 10*3/uL (ref 150–400)
RBC: 4.34 MIL/uL (ref 3.87–5.11)
RDW: 12.9 % (ref 11.5–15.5)
WBC: 5.6 10*3/uL (ref 4.0–10.5)
nRBC: 0 % (ref 0.0–0.2)

## 2023-07-29 LAB — TYPE AND SCREEN
ABO/RH(D): O NEG
Antibody Screen: NEGATIVE

## 2023-07-29 MED ORDER — GLYCOPYRROLATE 1 MG PO TABS
2.0000 mg | ORAL_TABLET | Freq: Three times a day (TID) | ORAL | Status: DC
  Administered 2023-07-29 – 2023-07-30 (×4): 2 mg via ORAL
  Filled 2023-07-29 (×4): qty 2

## 2023-07-29 MED ORDER — ONDANSETRON 4 MG PO TBDP
4.0000 mg | ORAL_TABLET | Freq: Four times a day (QID) | ORAL | Status: DC
  Administered 2023-07-29 – 2023-07-30 (×2): 8 mg via ORAL
  Filled 2023-07-29 (×3): qty 2

## 2023-07-29 MED ORDER — PROMETHAZINE HCL 25 MG PO TABS
25.0000 mg | ORAL_TABLET | Freq: Four times a day (QID) | ORAL | Status: DC
  Administered 2023-07-29 – 2023-07-30 (×5): 25 mg via ORAL
  Filled 2023-07-29 (×5): qty 1

## 2023-07-29 MED ORDER — PROMETHAZINE HCL 12.5 MG RE SUPP
12.5000 mg | Freq: Four times a day (QID) | RECTAL | Status: DC
  Filled 2023-07-29 (×7): qty 2

## 2023-07-29 MED ORDER — POTASSIUM CHLORIDE CRYS ER 20 MEQ PO TBCR
20.0000 meq | EXTENDED_RELEASE_TABLET | Freq: Two times a day (BID) | ORAL | Status: DC
  Administered 2023-07-29 – 2023-07-30 (×3): 20 meq via ORAL
  Filled 2023-07-29 (×3): qty 1

## 2023-07-29 MED ORDER — ACETAMINOPHEN 325 MG PO TABS
650.0000 mg | ORAL_TABLET | Freq: Four times a day (QID) | ORAL | Status: DC | PRN
  Administered 2023-07-29 (×2): 650 mg via ORAL
  Filled 2023-07-29 (×2): qty 2

## 2023-07-29 MED ORDER — POTASSIUM CHLORIDE 10 MEQ/100ML IV SOLN
10.0000 meq | INTRAVENOUS | Status: AC
  Administered 2023-07-29: 10 meq via INTRAVENOUS
  Filled 2023-07-29: qty 100

## 2023-07-29 MED ORDER — SCOPOLAMINE 1 MG/3DAYS TD PT72
1.0000 | MEDICATED_PATCH | TRANSDERMAL | Status: DC
  Administered 2023-07-29: 1.5 mg via TRANSDERMAL
  Filled 2023-07-29: qty 1

## 2023-07-29 MED ORDER — ONDANSETRON HCL 4 MG/2ML IJ SOLN
4.0000 mg | Freq: Four times a day (QID) | INTRAMUSCULAR | Status: DC
  Administered 2023-07-29 – 2023-07-30 (×3): 4 mg via INTRAVENOUS
  Filled 2023-07-29 (×3): qty 2

## 2023-07-29 NOTE — Plan of Care (Signed)
  Problem: Education: Goal: Knowledge of disease or condition will improve Outcome: Progressing Goal: Knowledge of the prescribed therapeutic regimen will improve Outcome: Progressing   Problem: Bowel/Gastric: Goal: Occurences of nausea and/or vomiting will decrease Outcome: Progressing   Problem: Fluid Volume: Goal: Maintenance of adequate hydration will improve Outcome: Progressing   Problem: Nutritional: Goal: Achievement of adequate weight for body size and type will improve Outcome: Progressing   Problem: Education: Goal: Knowledge of General Education information will improve Description: Including pain rating scale, medication(s)/side effects and non-pharmacologic comfort measures Outcome: Progressing   Problem: Health Behavior/Discharge Planning: Goal: Ability to manage health-related needs will improve Outcome: Progressing   Problem: Clinical Measurements: Goal: Ability to maintain clinical measurements within normal limits will improve Outcome: Progressing Goal: Will remain free from infection Outcome: Progressing Goal: Diagnostic test results will improve Outcome: Progressing Goal: Respiratory complications will improve Outcome: Progressing Goal: Cardiovascular complication will be avoided Outcome: Progressing   Problem: Activity: Goal: Risk for activity intolerance will decrease Outcome: Progressing   Problem: Nutrition: Goal: Adequate nutrition will be maintained Outcome: Progressing   Problem: Coping: Goal: Level of anxiety will decrease Outcome: Progressing   Problem: Elimination: Goal: Will not experience complications related to bowel motility Outcome: Progressing Goal: Will not experience complications related to urinary retention Outcome: Progressing   Problem: Pain Managment: Goal: General experience of comfort will improve and/or be controlled Outcome: Progressing   Problem: Safety: Goal: Ability to remain free from injury will  improve Outcome: Progressing   Problem: Skin Integrity: Goal: Risk for impaired skin integrity will decrease Outcome: Progressing

## 2023-07-29 NOTE — Assessment & Plan Note (Signed)
 Continue steroids Continue Pepcid Scheduled Zofran  Scheduled Phenergan  Add scopolamine 

## 2023-07-29 NOTE — Progress Notes (Signed)
 Assessment/Plan: Hyperemesis Continue steroids Continue Pepcid Scheduled Zofran  Scheduled Phenergan  Add scopolamine   Hypokalemia Replete and trend IV and p.o. repletion today  Ptyalism Scheduled Robinul  Language barrier Speaks [place]    Subjective: Interval History: Not feeling significantly better this morning  Objective: Vital signs in last 24 hours: Temp:  [97.9 F (36.6 C)-98.3 F (36.8 C)] 98.1 F (36.7 C) (06/28 0339) Pulse Rate:  [83-119] 83 (06/28 0339) Resp:  [15-18] 17 (06/28 0339) BP: (90-111)/(51-80) 94/52 (06/28 0339) SpO2:  [98 %-100 %] 100 % (06/28 0339) Weight:  [63.5 kg-67 kg] 67 kg (06/28 0339)  Intake/Output from previous day: 06/27 0701 - 06/28 0700 In: 2862 [I.V.:1302.1] Out: 650 [Urine:650] Intake/Output this shift: No intake/output data recorded.  General appearance: alert, cooperative, and appears stated age Lungs: Normal effort Heart: regular rate and rhythm Abdomen: soft, non-tender; bowel sounds normal; no masses,  no organomegaly  Results for orders placed or performed during the hospital encounter of 07/28/23 (from the past 24 hours)  Urinalysis, Routine w reflex microscopic -Urine, Clean Catch     Status: Abnormal   Collection Time: 07/28/23  1:24 PM  Result Value Ref Range   Color, Urine AMBER (A) YELLOW   APPearance CLOUDY (A) CLEAR   Specific Gravity, Urine 1.028 1.005 - 1.030   pH 5.0 5.0 - 8.0   Glucose, UA NEGATIVE NEGATIVE mg/dL   Hgb urine dipstick MODERATE (A) NEGATIVE   Bilirubin Urine NEGATIVE NEGATIVE   Ketones, ur 80 (A) NEGATIVE mg/dL   Protein, ur 899 (A) NEGATIVE mg/dL   Nitrite NEGATIVE NEGATIVE   Leukocytes,Ua SMALL (A) NEGATIVE   RBC / HPF >50 0 - 5 RBC/hpf   WBC, UA 11-20 0 - 5 WBC/hpf   Bacteria, UA RARE (A) NONE SEEN   Squamous Epithelial / HPF 21-50 0 - 5 /HPF   Mucus PRESENT    Hyaline Casts, UA PRESENT   Comprehensive metabolic panel     Status: Abnormal   Collection Time: 07/28/23  2:58  PM  Result Value Ref Range   Sodium 138 135 - 145 mmol/L   Potassium 3.3 (L) 3.5 - 5.1 mmol/L   Chloride 105 98 - 111 mmol/L   CO2 19 (L) 22 - 32 mmol/L   Glucose, Bld 96 70 - 99 mg/dL   BUN 12 6 - 20 mg/dL   Creatinine, Ser 9.16 0.44 - 1.00 mg/dL   Calcium 9.7 8.9 - 89.6 mg/dL   Total Protein 7.9 6.5 - 8.1 g/dL   Albumin 3.7 3.5 - 5.0 g/dL   AST 16 15 - 41 U/L   ALT 15 0 - 44 U/L   Alkaline Phosphatase 40 38 - 126 U/L   Total Bilirubin 1.1 0.0 - 1.2 mg/dL   GFR, Estimated >39 >39 mL/min   Anion gap 14 5 - 15  Lipase, blood     Status: Abnormal   Collection Time: 07/28/23  2:58 PM  Result Value Ref Range   Lipase 72 (H) 11 - 51 U/L  Basic metabolic panel     Status: Abnormal   Collection Time: 07/29/23  4:05 AM  Result Value Ref Range   Sodium 135 135 - 145 mmol/L   Potassium 3.4 (L) 3.5 - 5.1 mmol/L   Chloride 104 98 - 111 mmol/L   CO2 21 (L) 22 - 32 mmol/L   Glucose, Bld 101 (H) 70 - 99 mg/dL   BUN 5 (L) 6 - 20 mg/dL   Creatinine, Ser 9.40 0.44 - 1.00  mg/dL   Calcium 9.0 8.9 - 89.6 mg/dL   GFR, Estimated >39 >39 mL/min   Anion gap 10 5 - 15  Type and screen [facility]     Status: None   Collection Time: 07/29/23  5:35 AM  Result Value Ref Range   ABO/RH(D) O NEG    Antibody Screen NEG    Sample Expiration      08/01/2023,2359 Performed at [facility], [address], [place], [place] [address]     Studies/Results: US  OB Comp < 14 Wks Result Date: 07/19/2023 CLINICAL DATA:  Abdominal pain and vomiting. EXAM: OBSTETRIC <14 WK ULTRASOUND TECHNIQUE: Transabdominal ultrasound was performed for evaluation of the gestation as well as the maternal uterus and adnexal regions. COMPARISON:  Jun 27, 2023 FINDINGS: Intrauterine gestational sac: Single Yolk sac:  Visualized. Embryo:  Visualized. Cardiac Activity: Visualized. Heart Rate: 175 bpm CRL:   17.7 mm   8 w 2 d                  US  EDC: February 26, 2024 Subchorionic hemorrhage:  Small (1.1 cm x 1.3  cm x 0.7 cm Maternal uterus/adnexae: The right ovary is visualized and contains a 1.4 cm x 1.2 cm x 1.4 cm cystic appearing area. The left ovary is visualized and is unremarkable. No pelvic free fluid is seen. IMPRESSION: 1. Single, viable intrauterine pregnancy at approximately 8 weeks and 2 days gestation by ultrasound evaluation. 2. Findings which may represent a resolving right corpus luteum cyst. Correlation with 6-12 week follow-up pelvic ultrasound is recommended. Electronically Signed   By: Suzen Dials M.D.   On: 07/19/2023 19:52   US  OB Transvaginal Result Date: 07/19/2023 CLINICAL DATA:  Abdominal pain and vomiting. EXAM: OBSTETRIC <14 WK ULTRASOUND TECHNIQUE: Transabdominal ultrasound was performed for evaluation of the gestation as well as the maternal uterus and adnexal regions. COMPARISON:  Jun 27, 2023 FINDINGS: Intrauterine gestational sac: Single Yolk sac:  Visualized. Embryo:  Visualized. Cardiac Activity: Visualized. Heart Rate: 175 bpm CRL:   17.7 mm   8 w 2 d                  US  EDC: February 26, 2024 Subchorionic hemorrhage:  Small (1.1 cm x 1.3 cm x 0.7 cm Maternal uterus/adnexae: The right ovary is visualized and contains a 1.4 cm x 1.2 cm x 1.4 cm cystic appearing area. The left ovary is visualized and is unremarkable. No pelvic free fluid is seen. IMPRESSION: 1. Single, viable intrauterine pregnancy at approximately 8 weeks and 2 days gestation by ultrasound evaluation. 2. Findings which may represent a resolving right corpus luteum cyst. Correlation with 6-12 week follow-up pelvic ultrasound is recommended. Electronically Signed   By: Suzen Dials M.D.   On: 07/19/2023 19:52    Scheduled Meds:  pyridOXINE  25 mg Oral BID   And   doxylamine  (Sleep)  25 mg Oral BID   famotidine  20 mg Oral Q12H   glycopyrrolate  2 mg Oral TID   methylPREDNISolone  16 mg Oral Q breakfast   Followed by   NOREEN ON 08/02/2023] methylPREDNISolone  8 mg Oral Q breakfast   Followed by    NOREEN ON 08/09/2023] methylPREDNISolone  4 mg Oral Q breakfast   methylPREDNISolone  16 mg Oral Q1400   Followed by   NOREEN ON 07/31/2023] methylPREDNISolone  8 mg Oral Q1400   Followed by   NOREEN ON 08/03/2023] methylPREDNISolone  4 mg Oral  Q1400   methylPREDNISolone  16 mg Oral QHS   Followed by   NOREEN ON 08/01/2023] methylPREDNISolone  8 mg Oral QHS   Followed by   NOREEN ON 08/04/2023] methylPREDNISolone  4 mg Oral QHS   potassium chloride   20 mEq Oral BID   scopolamine   1 patch Transdermal Q72H   Continuous Infusions:  famotidine (PEPCID) IV Stopped (07/28/23 2151)   lactated ringers  125 mL/hr at 07/28/23 2120   potassium chloride      PRN Meds:acetaminophen , calcium carbonate, ondansetron  **OR** ondansetron  (ZOFRAN ) IV, promethazine  **OR** promethazine     LOS: 1 day   Glenys GORMAN Birk, MD 07/29/2023 7:46 AM

## 2023-07-29 NOTE — Progress Notes (Signed)
 Patient reports no nausea or vomiting today. Keeping down soft foods.

## 2023-07-29 NOTE — Progress Notes (Signed)
 Patient tolerating soft foods; fish etc. Complains of unchanged ptyalism

## 2023-07-29 NOTE — Assessment & Plan Note (Signed)
 Speaks [place]

## 2023-07-29 NOTE — Assessment & Plan Note (Signed)
 Scheduled Robinul

## 2023-07-29 NOTE — Assessment & Plan Note (Signed)
 Replete and trend IV and p.o. repletion today

## 2023-07-30 DIAGNOSIS — O26891 Other specified pregnancy related conditions, first trimester: Secondary | ICD-10-CM | POA: Diagnosis not present

## 2023-07-30 DIAGNOSIS — Z3A09 9 weeks gestation of pregnancy: Secondary | ICD-10-CM | POA: Diagnosis not present

## 2023-07-30 DIAGNOSIS — R1110 Vomiting, unspecified: Secondary | ICD-10-CM | POA: Diagnosis not present

## 2023-07-30 LAB — BASIC METABOLIC PANEL WITH GFR
Anion gap: 6 (ref 5–15)
BUN: <5 mg/dL — ABNORMAL LOW (ref 6–20)
CO2: 28 mmol/L (ref 22–32)
Calcium: 9.1 mg/dL (ref 8.9–10.3)
Chloride: 102 mmol/L (ref 98–111)
Creatinine, Ser: 0.59 mg/dL (ref 0.44–1.00)
GFR, Estimated: >60 mL/min (ref >60)
Glucose, Bld: 114 mg/dL — ABNORMAL HIGH (ref 70–99)
Potassium: 3.3 mmol/L — ABNORMAL LOW (ref 3.5–5.1)
Sodium: 136 mmol/L (ref 135–145)

## 2023-07-30 LAB — TSH: TSH: 0.071 u[IU]/mL — ABNORMAL LOW (ref 0.350–4.500)

## 2023-07-30 LAB — T4, FREE: Free T4: 0.98 ng/dL (ref 0.61–1.12)

## 2023-07-30 MED ORDER — ONDANSETRON 4 MG PO TBDP: 4.0000 mg | ORAL_TABLET | Freq: Four times a day (QID) | ORAL | 3 refills | Status: DC | PRN

## 2023-07-30 MED ORDER — FAMOTIDINE 20 MG PO TABS: 20.0000 mg | ORAL_TABLET | Freq: Two times a day (BID) | ORAL | 3 refills | Status: DC

## 2023-07-30 MED ORDER — ZOLPIDEM TARTRATE ER 12.5 MG PO TBCR: 12.5000 mg | EXTENDED_RELEASE_TABLET | Freq: Every evening | ORAL | 0 refills | Status: DC | PRN

## 2023-07-30 MED ORDER — POTASSIUM CHLORIDE CRYS ER 20 MEQ PO TBCR: 20.0000 meq | EXTENDED_RELEASE_TABLET | Freq: Two times a day (BID) | ORAL | 3 refills | Status: DC

## 2023-07-30 MED ORDER — ONDANSETRON 4 MG PO TBDP: 4.0000 mg | ORAL_TABLET | Freq: Four times a day (QID) | ORAL | 0 refills | Status: DC

## 2023-07-30 MED ORDER — PROMETHAZINE HCL 12.5 MG RE SUPP: 12.5000 mg | Freq: Four times a day (QID) | RECTAL | 0 refills | Status: DC

## 2023-07-30 MED ORDER — GLYCOPYRROLATE 1 MG PO TABS: 2.0000 mg | ORAL_TABLET | Freq: Three times a day (TID) | ORAL | 0 refills | Status: DC

## 2023-07-30 NOTE — Plan of Care (Signed)
  Problem: Education: Goal: Knowledge of disease or condition will improve Outcome: Progressing Goal: Knowledge of the prescribed therapeutic regimen will improve Outcome: Progressing   Problem: Bowel/Gastric: Goal: Occurences of nausea and/or vomiting will decrease Outcome: Progressing   Problem: Fluid Volume: Goal: Maintenance of adequate hydration will improve Outcome: Progressing   Problem: Nutritional: Goal: Achievement of adequate weight for body size and type will improve Outcome: Progressing   Problem: Education: Goal: Knowledge of General Education information will improve Description: Including pain rating scale, medication(s)/side effects and non-pharmacologic comfort measures Outcome: Progressing   Problem: Health Behavior/Discharge Planning: Goal: Ability to manage health-related needs will improve Outcome: Progressing   Problem: Clinical Measurements: Goal: Ability to maintain clinical measurements within normal limits will improve Outcome: Progressing Goal: Will remain free from infection Outcome: Progressing Goal: Diagnostic test results will improve Outcome: Progressing Goal: Respiratory complications will improve Outcome: Progressing Goal: Cardiovascular complication will be avoided Outcome: Progressing   Problem: Activity: Goal: Risk for activity intolerance will decrease Outcome: Progressing   Problem: Nutrition: Goal: Adequate nutrition will be maintained Outcome: Progressing   Problem: Coping: Goal: Level of anxiety will decrease Outcome: Progressing   Problem: Elimination: Goal: Will not experience complications related to bowel motility Outcome: Progressing Goal: Will not experience complications related to urinary retention Outcome: Progressing   Problem: Pain Managment: Goal: General experience of comfort will improve and/or be controlled Outcome: Progressing   Problem: Safety: Goal: Ability to remain free from injury will  improve Outcome: Progressing   Problem: Skin Integrity: Goal: Risk for impaired skin integrity will decrease Outcome: Progressing

## 2023-07-30 NOTE — Plan of Care (Signed)
  Problem: Education: Goal: Knowledge of disease or condition will improve 07/30/2023 1124 by Arlys Alan RAMAN, RN Outcome: Completed/Met 07/30/2023 0724 by Arlys Alan RAMAN, RN Outcome: Progressing Goal: Knowledge of the prescribed therapeutic regimen will improve 07/30/2023 1124 by Arlys Alan RAMAN, RN Outcome: Completed/Met 07/30/2023 0724 by Arlys Alan RAMAN, RN Outcome: Progressing   Problem: Bowel/Gastric: Goal: Occurences of nausea and/or vomiting will decrease 07/30/2023 1124 by Arlys Alan RAMAN, RN Outcome: Completed/Met 07/30/2023 0724 by Arlys Alan RAMAN, RN Outcome: Progressing   Problem: Fluid Volume: Goal: Maintenance of adequate hydration will improve 07/30/2023 1124 by Arlys Alan RAMAN, RN Outcome: Completed/Met 07/30/2023 0724 by Arlys Alan RAMAN, RN Outcome: Progressing   Problem: Nutritional: Goal: Achievement of adequate weight for body size and type will improve 07/30/2023 1124 by Arlys Alan RAMAN, RN Outcome: Completed/Met 07/30/2023 0724 by Arlys Alan RAMAN, RN Outcome: Progressing   Problem: Education: Goal: Knowledge of General Education information will improve Description: Including pain rating scale, medication(s)/side effects and non-pharmacologic comfort measures 07/30/2023 1124 by Arlys Alan RAMAN, RN Outcome: Completed/Met 07/30/2023 0724 by Arlys Alan RAMAN, RN Outcome: Progressing   Problem: Health Behavior/Discharge Planning: Goal: Ability to manage health-related needs will improve 07/30/2023 1124 by Arlys Alan RAMAN, RN Outcome: Completed/Met 07/30/2023 0724 by Arlys Alan RAMAN, RN Outcome: Progressing   Problem: Clinical Measurements: Goal: Ability to maintain clinical measurements within normal limits will improve 07/30/2023 1124 by Arlys Alan RAMAN, RN Outcome: Completed/Met 07/30/2023 0724 by Arlys Alan RAMAN, RN Outcome: Progressing Goal: Will remain free from infection 07/30/2023 1124 by Arlys Alan RAMAN, RN Outcome: Completed/Met 07/30/2023 0724 by Arlys Alan RAMAN, RN Outcome:  Progressing Goal: Diagnostic test results will improve 07/30/2023 1124 by Arlys Alan RAMAN, RN Outcome: Completed/Met 07/30/2023 0724 by Arlys Alan RAMAN, RN Outcome: Progressing Goal: Respiratory complications will improve 07/30/2023 1124 by Arlys Alan RAMAN, RN Outcome: Completed/Met 07/30/2023 0724 by Arlys Alan RAMAN, RN Outcome: Progressing Goal: Cardiovascular complication will be avoided 07/30/2023 1124 by Arlys Alan RAMAN, RN Outcome: Completed/Met 07/30/2023 0724 by Arlys Alan RAMAN, RN Outcome: Progressing   Problem: Activity: Goal: Risk for activity intolerance will decrease 07/30/2023 1124 by Arlys Alan RAMAN, RN Outcome: Completed/Met 07/30/2023 0724 by Arlys Alan RAMAN, RN Outcome: Progressing   Problem: Nutrition: Goal: Adequate nutrition will be maintained 07/30/2023 1124 by Arlys Alan RAMAN, RN Outcome: Completed/Met 07/30/2023 0724 by Arlys Alan RAMAN, RN Outcome: Progressing   Problem: Coping: Goal: Level of anxiety will decrease 07/30/2023 1124 by Arlys Alan RAMAN, RN Outcome: Completed/Met 07/30/2023 0724 by Arlys Alan RAMAN, RN Outcome: Progressing   Problem: Elimination: Goal: Will not experience complications related to bowel motility 07/30/2023 1124 by Arlys Alan RAMAN, RN Outcome: Completed/Met 07/30/2023 0724 by Arlys Alan RAMAN, RN Outcome: Progressing Goal: Will not experience complications related to urinary retention 07/30/2023 1124 by Arlys Alan RAMAN, RN Outcome: Completed/Met 07/30/2023 0724 by Arlys Alan RAMAN, RN Outcome: Progressing   Problem: Pain Managment: Goal: General experience of comfort will improve and/or be controlled 07/30/2023 1124 by Arlys Alan RAMAN, RN Outcome: Completed/Met 07/30/2023 0724 by Arlys Alan RAMAN, RN Outcome: Progressing   Problem: Safety: Goal: Ability to remain free from injury will improve 07/30/2023 1124 by Arlys Alan RAMAN, RN Outcome: Completed/Met 07/30/2023 0724 by Arlys Alan RAMAN, RN Outcome: Progressing   Problem: Skin Integrity: Goal: Risk for impaired  skin integrity will decrease 07/30/2023 1124 by Arlys Alan RAMAN, RN Outcome: Completed/Met 07/30/2023 0724 by Arlys Alan RAMAN, RN Outcome: Progressing

## 2023-07-30 NOTE — Discharge Summary (Addendum)
 Physician Discharge Summary  Patient ID: Pamela Santos MRN: [id number] DOB/AGE: 1990-03-23 33 y.o.  Admit date: 07/28/2023 Discharge date: 07/30/2023   Discharge Diagnoses:  Principal Problem:   Hyperemesis Active Problems:   Language barrier   Hypokalemia   Ptyalism   Consults: None  Significant Diagnostic Studies:  Results for orders placed or performed during the hospital encounter of 07/28/23 (from the past 72 hours)  Urinalysis, Routine w reflex microscopic -Urine, Clean Catch     Status: Abnormal   Collection Time: 07/28/23  1:24 PM  Result Value Ref Range   Color, Urine AMBER (A) YELLOW    Comment: BIOCHEMICALS MAY BE AFFECTED BY COLOR   APPearance CLOUDY (A) CLEAR   Specific Gravity, Urine 1.028 1.005 - 1.030   pH 5.0 5.0 - 8.0   Glucose, UA NEGATIVE NEGATIVE mg/dL   Hgb urine dipstick MODERATE (A) NEGATIVE   Bilirubin Urine NEGATIVE NEGATIVE   Ketones, ur 80 (A) NEGATIVE mg/dL   Protein, ur 899 (A) NEGATIVE mg/dL   Nitrite NEGATIVE NEGATIVE   Leukocytes,Ua SMALL (A) NEGATIVE   RBC / HPF >50 0 - 5 RBC/hpf   WBC, UA 11-20 0 - 5 WBC/hpf   Bacteria, UA RARE (A) NONE SEEN   Squamous Epithelial / HPF 21-50 0 - 5 /HPF   Mucus PRESENT    Hyaline Casts, UA PRESENT     Comment: Performed at [facility], [address] [place], [place] [address]  Comprehensive metabolic panel     Status: Abnormal   Collection Time: 07/28/23  2:58 PM  Result Value Ref Range   Sodium 138 135 - 145 mmol/L   Potassium 3.3 (L) 3.5 - 5.1 mmol/L   Chloride 105 98 - 111 mmol/L   CO2 19 (L) 22 - 32 mmol/L   Glucose, Bld 96 70 - 99 mg/dL    Comment: Glucose reference range applies only to samples taken after fasting for at least 8 hours.   BUN 12 6 - 20 mg/dL   Creatinine, Ser 9.16 0.44 - 1.00 mg/dL   Calcium 9.7 8.9 - 89.6 mg/dL   Total Protein 7.9 6.5 - 8.1 g/dL   Albumin 3.7 3.5 - 5.0 g/dL   AST 16 15 - 41 U/L   ALT 15 0 - 44 U/L   Alkaline Phosphatase 40 38 - 126  U/L   Total Bilirubin 1.1 0.0 - 1.2 mg/dL   GFR, Estimated >39 >39 mL/min    Comment: (NOTE) Calculated using the CKD-EPI Creatinine Equation (2021)    Anion gap 14 5 - 15    Comment: Performed at [facility], [address], [place], [place] [address]  Lipase, blood     Status: Abnormal   Collection Time: 07/28/23  2:58 PM  Result Value Ref Range   Lipase 72 (H) 11 - 51 U/L    Comment: Performed at [facility], [address], [place], [place] [address]  Basic metabolic panel     Status: Abnormal   Collection Time: 07/29/23  4:05 AM  Result Value Ref Range   Sodium 135 135 - 145 mmol/L   Potassium 3.4 (L) 3.5 - 5.1 mmol/L   Chloride 104 98 - 111 mmol/L   CO2 21 (L) 22 - 32 mmol/L   Glucose, Bld 101 (H) 70 - 99 mg/dL    Comment: Glucose reference range applies only to samples taken after fasting for at least 8 hours.   BUN 5 (L) 6 -  20 mg/dL   Creatinine, Ser 9.40 0.44 - 1.00 mg/dL   Calcium 9.0 8.9 - 89.6 mg/dL   GFR, Estimated >39 >39 mL/min    Comment: (NOTE) Calculated using the CKD-EPI Creatinine Equation (2021)    Anion gap 10 5 - 15    Comment: Performed at [facility], [address], [place], [place] [address]  Type and screen [facility]     Status: None   Collection Time: 07/29/23  5:35 AM  Result Value Ref Range   ABO/RH(D) O NEG    Antibody Screen NEG    Sample Expiration      08/01/2023,2359 Performed at [facility], [address] [place], [place] [address]   CBC     Status: None   Collection Time: 07/29/23  5:38 AM  Result Value Ref Range   WBC 5.6 4.0 - 10.5 K/uL   RBC 4.34 3.87 - 5.11 MIL/uL   Hemoglobin 12.7 12.0 - 15.0 g/dL   HCT 62.7 63.9 - 53.9 %   MCV 85.7 80.0 - 100.0 fL   MCH 29.3 26.0 - 34.0 pg   MCHC 34.1 30.0 - 36.0 g/dL   RDW 87.0 88.4 - 84.4 %   Platelets 250 150 - 400 K/uL   nRBC 0.0 0.0 - 0.2 %    Comment: Performed at [facility], [address], [place], [place] [address]   Basic metabolic panel     Status: Abnormal   Collection Time: 07/30/23  4:16 AM  Result Value Ref Range   Sodium 136 135 - 145 mmol/L   Potassium 3.3 (L) 3.5 - 5.1 mmol/L   Chloride 102 98 - 111 mmol/L   CO2 28 22 - 32 mmol/L   Glucose, Bld 114 (H) 70 - 99 mg/dL    Comment: Glucose reference range applies only to samples taken after fasting for at least 8 hours.   BUN <5 (L) 6 - 20 mg/dL   Creatinine, Ser 9.40 0.44 - 1.00 mg/dL   Calcium 9.1 8.9 - 89.6 mg/dL   GFR, Estimated >39 >39 mL/min    Comment: (NOTE) Calculated using the CKD-EPI Creatinine Equation (2021)    Anion gap 6 5 - 15    Comment: Performed at [facility], [address], [place], [place] [address]  TSH     Status: Abnormal   Collection Time: 07/30/23  4:16 AM  Result Value Ref Range   TSH 0.071 (L) 0.350 - 4.500 uIU/mL    Comment: Performed by a 3rd Generation assay with a functional sensitivity of <=0.01 uIU/mL. Performed at [facility], [address], [place], [place] [address]   T4, free     Status: None   Collection Time: 07/30/23  4:16 AM  Result Value Ref Range   Free T4 0.98 0.61 - 1.12 ng/dL    Comment: (NOTE) Biotin ingestion may interfere with free T4 tests. If the results are inconsistent with the TSH level, previous test results, or the clinical presentation, then consider biotin interference. If needed, order repeat testing after stopping biotin. Performed at [facility], [address], [place], [place] [address]      US  OB Comp < 14 Wks Result Date: 07/19/2023 CLINICAL DATA:  Abdominal pain and vomiting. EXAM: OBSTETRIC <14 WK ULTRASOUND TECHNIQUE: Transabdominal ultrasound was performed for evaluation of the gestation as well as the maternal uterus and adnexal regions. COMPARISON:  Jun 27, 2023 FINDINGS: Intrauterine  gestational sac: Single Yolk sac:  Visualized. Embryo:  Visualized. Cardiac Activity: Visualized. Heart Rate: 175 bpm CRL:   17.7 mm   8 w 2 d                   US  EDC: February 26, 2024 Subchorionic hemorrhage:  Small (1.1 cm x 1.3 cm x 0.7 cm Maternal uterus/adnexae: The right ovary is visualized and contains a 1.4 cm x 1.2 cm x 1.4 cm cystic appearing area. The left ovary is visualized and is unremarkable. No pelvic free fluid is seen. IMPRESSION: 1. Single, viable intrauterine pregnancy at approximately 8 weeks and 2 days gestation by ultrasound evaluation. 2. Findings which may represent a resolving right corpus luteum cyst. Correlation with 6-12 week follow-up pelvic ultrasound is recommended. Electronically Signed   By: Suzen Dials M.D.   On: 07/19/2023 19:52   US  OB Transvaginal Result Date: 07/19/2023 CLINICAL DATA:  Abdominal pain and vomiting. EXAM: OBSTETRIC <14 WK ULTRASOUND TECHNIQUE: Transabdominal ultrasound was performed for evaluation of the gestation as well as the maternal uterus and adnexal regions. COMPARISON:  Jun 27, 2023 FINDINGS: Intrauterine gestational sac: Single Yolk sac:  Visualized. Embryo:  Visualized. Cardiac Activity: Visualized. Heart Rate: 175 bpm CRL:   17.7 mm   8 w 2 d                  US  EDC: February 26, 2024 Subchorionic hemorrhage:  Small (1.1 cm x 1.3 cm x 0.7 cm Maternal uterus/adnexae: The right ovary is visualized and contains a 1.4 cm x 1.2 cm x 1.4 cm cystic appearing area. The left ovary is visualized and is unremarkable. No pelvic free fluid is seen. IMPRESSION: 1. Single, viable intrauterine pregnancy at approximately 8 weeks and 2 days gestation by ultrasound evaluation. 2. Findings which may represent a resolving right corpus luteum cyst. Correlation with 6-12 week follow-up pelvic ultrasound is recommended. Electronically Signed   By: Suzen Dials M.D.   On: 07/19/2023 19:52     Hospital Course: Patient was admitted with hyperemesis gravidarum.  Her weight had been 68 kg and was down to 63 kg on admission.  She was given vigorous IV hydration, scopolamine  patch, Phenergan , Zofran , Robinul in the MAU  but was unable to tolerate p.o.  She was admitted to the specialty care where she continued her IV hydration and antiemetics.  Her antiemetics were changed to scheduled, scopolamine  patch was placed.  She was placed on Medrol taper.  The patient became hungry and wanted to increase her diet which was started on clears.  She tolerated this well and then had some solid food in the evening.  Her weight increased to 66 kg and it was felt she was stable for discharge.  Patient continued to have some spitting and will be discharged home on multiple antiemetics, Robinul, Medrol taper.     Disposition: Discharge disposition: 01-Home or Self Care       Discharged Condition: Improved  Discharge Instructions     Discharge activity:  No Restrictions   Complete by: As directed    Discharge diet:   Complete by: As directed    Bland, Push fluids      Allergies as of 07/30/2023   No Known Allergies      Medication List     STOP taking these medications    acetaminophen  325 MG tablet Commonly known as: Tylenol    ibuprofen  600 MG tablet Commonly known as: ADVIL    melatonin 1 MG  Tabs tablet   Metamucil Smooth Texture 58.6 % powder Generic drug: psyllium       TAKE these medications    Diclegis 10-10 MG Tbec Generic drug: Doxylamine -Pyridoxine Take 1 tablet by mouth at bedtime.   famotidine 20 MG tablet Commonly known as: PEPCID Take 1 tablet (20 mg total) by mouth every 12 (twelve) hours.   glycopyrrolate 1 MG tablet Commonly known as: ROBINUL Take 2 tablets (2 mg total) by mouth 3 (three) times daily.   ondansetron  4 MG disintegrating tablet Commonly known as: ZOFRAN -ODT Take 1-2 tablets (4-8 mg total) by mouth every 6 (six) hours.   ondansetron  4 MG disintegrating tablet Commonly known as: ZOFRAN -ODT Take 1 tablet (4 mg total) by mouth every 6 (six) hours as needed for nausea.   potassium chloride  SA 20 MEQ tablet Commonly known as: KLOR-CON  M Take 1 tablet (20  mEq total) by mouth 2 (two) times daily.   promethazine  25 MG tablet Commonly known as: PHENERGAN  Take 1 tablet (25 mg total) by mouth every 6 (six) hours as needed for nausea or vomiting.   promethazine  12.5 MG suppository Commonly known as: PHENERGAN  Place 1-2 suppositories (12.5-25 mg total) rectally every 6 (six) hours.   scopolamine  1 MG/3DAYS Commonly known as: TRANSDERM-SCOP Place 1 patch (1.5 mg total) onto the skin every 3 (three) days.   zolpidem 12.5 MG CR tablet Commonly known as: Ambien CR Take 1 tablet (12.5 mg total) by mouth at bedtime as needed for sleep.        Follow-up Information     [facility] Follow up.   Specialty: Obstetrics and Gynecology Contact information: [address] [place]  [address] [phone]               Time spent on discharge approximately 37 minutes Signed: Vonn VEAR Inch 07/30/2023, 1:22 PM

## 2023-08-01 ENCOUNTER — Ambulatory Visit

## 2023-08-01 VITALS — BP 100/65 | HR 112 | Wt 150.2 lb

## 2023-08-01 DIAGNOSIS — O0991 Supervision of high risk pregnancy, unspecified, first trimester: Secondary | ICD-10-CM

## 2023-08-01 DIAGNOSIS — O0990 Supervision of high risk pregnancy, unspecified, unspecified trimester: Secondary | ICD-10-CM | POA: Insufficient documentation

## 2023-08-01 DIAGNOSIS — Z0289 Encounter for other administrative examinations: Secondary | ICD-10-CM

## 2023-08-01 DIAGNOSIS — Z3490 Encounter for supervision of normal pregnancy, unspecified, unspecified trimester: Secondary | ICD-10-CM | POA: Insufficient documentation

## 2023-08-01 DIAGNOSIS — Z3A10 10 weeks gestation of pregnancy: Secondary | ICD-10-CM

## 2023-08-01 MED ORDER — PRENATAL PLUS 27-1 MG PO TABS: 1.0000 | ORAL_TABLET | Freq: Every day | ORAL | 11 refills | Status: AC

## 2023-08-01 NOTE — Progress Notes (Signed)
 New OB Intake  I connected with Pamela Santos  on 08/01/23 at  3:15 PM EDT by In Person Visit and verified that I am speaking with the correct person using two identifiers. Nurse is located at [facility] and pt is located at [facility].  I discussed the limitations, risks, security and privacy concerns of performing an evaluation and management service by telephone and the availability of in person appointments. I also discussed with the patient that there may be a patient responsible charge related to this service. The patient expressed understanding and agreed to proceed.  I explained I am completing New OB Intake today. We discussed EDD of 02/26/2024, by Ultrasound. Pt is G2P1001. I reviewed her allergies, medications and Medical/Surgical/OB history.    Patient Active Problem List   Diagnosis Date Noted   Hypokalemia 07/29/2023   Ptyalism 07/29/2023   Hyperemesis 07/28/2023   Language barrier 03/06/2021     Concerns addressed today  Delivery Plans Plans to deliver at [facility]. Discussed the nature of our practice with multiple providers including residents and students. Due to the size of the practice, the delivering provider may not be the same as those providing prenatal care.   Patient is not interested in water birth.  MyChart/Babyscripts MyChart access verified. I explained pt will have some visits in office and some virtually. Babyscripts instructions given and order placed. Patient verifies receipt of registration text/e-mail. Account successfully created and app downloaded. If patient is a candidate for Optimized scheduling, add to sticky note.   Blood Pressure Cuff/Weight Scale Blood pressure cuff ordered for patient to pick-up from [employer]. Explained after first prenatal appt pt will check weekly and document in Babyscripts. Patient does have weight scale.  Anatomy US  Explained first scheduled US  will be around 19 weeks. Anatomy US  Pending.  Is patient a  CenteringPregnancy candidate?    Not a candidate due to Language barrier If accepted,    Is patient a Mom+Baby Combined Care candidate?  Not a candidate   If accepted, confirm patient does not intend to move from the area for at least 12 months, then notify Mom+Baby staff  Is patient a candidate for Babyscripts Optimization?    First visit review I reviewed new OB appt with patient. Explained pt will be seen by Dr.Leveque at first visit. Discussed Pamela Santos genetic screening with patient. Needs Panorama and Horizon.. Routine prenatal labs needed at [facility] OB visit.   Last Pap Diagnosis  Date Value Ref Range Status  08/10/2021   Final   - Negative for intraepithelial lesion or malignancy (NILM)    Pamela Santos, CMA 08/01/2023  3:12 PM

## 2023-08-02 ENCOUNTER — Other Ambulatory Visit: Payer: Self-pay

## 2023-08-02 DIAGNOSIS — O0990 Supervision of high risk pregnancy, unspecified, unspecified trimester: Secondary | ICD-10-CM

## 2023-08-08 ENCOUNTER — Other Ambulatory Visit: Payer: Self-pay

## 2023-08-08 ENCOUNTER — Ambulatory Visit (INDEPENDENT_AMBULATORY_CARE_PROVIDER_SITE_OTHER): Payer: Self-pay | Admitting: Obstetrics and Gynecology

## 2023-08-08 ENCOUNTER — Other Ambulatory Visit (HOSPITAL_COMMUNITY)
Admission: RE | Admit: 2023-08-08 | Discharge: 2023-08-08 | Disposition: A | Discharge status: Home / Self Care | Source: Ambulatory Visit | Attending: Obstetrics and Gynecology | Admitting: Obstetrics and Gynecology

## 2023-08-08 VITALS — BP 107/69 | HR 111 | Wt 150.8 lb

## 2023-08-08 DIAGNOSIS — N760 Acute vaginitis: Secondary | ICD-10-CM

## 2023-08-08 DIAGNOSIS — B3731 Acute candidiasis of vulva and vagina: Secondary | ICD-10-CM

## 2023-08-08 DIAGNOSIS — O0991 Supervision of high risk pregnancy, unspecified, first trimester: Secondary | ICD-10-CM | POA: Diagnosis not present

## 2023-08-08 DIAGNOSIS — Z3A11 11 weeks gestation of pregnancy: Secondary | ICD-10-CM | POA: Insufficient documentation

## 2023-08-08 DIAGNOSIS — O0990 Supervision of high risk pregnancy, unspecified, unspecified trimester: Secondary | ICD-10-CM

## 2023-08-08 DIAGNOSIS — K117 Disturbances of salivary secretion: Secondary | ICD-10-CM

## 2023-08-08 DIAGNOSIS — E876 Hypokalemia: Secondary | ICD-10-CM

## 2023-08-08 DIAGNOSIS — Z31430 Encounter of female for testing for genetic disease carrier status for procreative management: Secondary | ICD-10-CM

## 2023-08-08 DIAGNOSIS — Z603 Acculturation difficulty: Secondary | ICD-10-CM | POA: Diagnosis not present

## 2023-08-08 DIAGNOSIS — Z3490 Encounter for supervision of normal pregnancy, unspecified, unspecified trimester: Secondary | ICD-10-CM

## 2023-08-08 DIAGNOSIS — Z758 Other problems related to medical facilities and other health care: Secondary | ICD-10-CM

## 2023-08-08 DIAGNOSIS — R1110 Vomiting, unspecified: Secondary | ICD-10-CM

## 2023-08-08 DIAGNOSIS — B9689 Other specified bacterial agents as the cause of diseases classified elsewhere: Secondary | ICD-10-CM

## 2023-08-08 MED ORDER — ACETAMINOPHEN 500 MG PO TABS
500.0000 mg | ORAL_TABLET | Freq: Four times a day (QID) | ORAL | 0 refills | Status: DC | PRN
Start: 2023-08-08 — End: 2023-11-27

## 2023-08-08 NOTE — Patient Instructions (Signed)

## 2023-08-08 NOTE — Progress Notes (Signed)
 Subjective:   Pamela Santos is a 33 y.o. G2P1001 at [redacted]w[redacted]d by early ultrasound being seen today for her first obstetrical visit.  Her obstetrical history is significant for uncomplicated vaginal delivery with first pregnancy. Patient does intend to breast feed. Pregnancy history fully reviewed.  Patient reports no complaints. Headache. Constipation.   HISTORY: OB History  Gravida Para Term Preterm AB Living  2 1 1  0 0 1  SAB IAB Ectopic Multiple Live Births  0 0 0 1 1    # Outcome Date GA Lbr Len/2nd Weight Sex Type Anes PTL Lv  2A Gravida           2B Current           1 Term 06/10/21 [redacted]w[redacted]d / 00:45 6 lb 7.7 oz (2.94 kg) M Vag-Spont None  LIV     Name: Eriksson,BOY Freja     Apgar1: 9  Apgar5: 9    Last pap smear was  08/10/2021 and was normal. Next due 07/2024.  Past Medical History:  Diagnosis Date   Rh negative state in antepartum period 03/06/2021   Tibia/fibula fracture, shaft, left, closed, initial encounter 12/15/2020   Added automatically from request for surgery [id number]   Past Surgical History:  Procedure Laterality Date   TIBIA IM NAIL INSERTION Left 12/16/2020   Procedure: INTRAMEDULLARY (IM) NAIL TIBIAL;  Surgeon: Kendal Franky SQUIBB, MD;  Location: [facility] OR;  Service: Orthopedics;  Laterality: Left;   Family History  Problem Relation Age of Onset   Diabetes Neg Hx    Hypertension Neg Hx    Cancer Neg Hx    Social History   Tobacco Use   Smoking status: Never   Smokeless tobacco: Never  Vaping Use   Vaping status: Never Used  Substance Use Topics   Alcohol use: Not Currently   Drug use: Never   No Known Allergies Current Outpatient Medications on File Prior to Visit  Medication Sig Dispense Refill   Doxylamine -Pyridoxine  (DICLEGIS ) 10-10 MG TBEC Take 1 tablet by mouth at bedtime. 60 tablet 5   famotidine  (PEPCID ) 20 MG tablet Take 1 tablet (20 mg total) by mouth every 12 (twelve) hours. 60 tablet 3   glycopyrrolate  (ROBINUL ) 1 MG tablet Take 2  tablets (2 mg total) by mouth 3 (three) times daily. 180 tablet 0   ondansetron  (ZOFRAN -ODT) 4 MG disintegrating tablet Take 1-2 tablets (4-8 mg total) by mouth every 6 (six) hours. 20 tablet 0   ondansetron  (ZOFRAN -ODT) 4 MG disintegrating tablet Take 1 tablet (4 mg total) by mouth every 6 (six) hours as needed for nausea. 90 tablet 3   potassium chloride  SA (KLOR-CON  M) 20 MEQ tablet Take 1 tablet (20 mEq total) by mouth 2 (two) times daily. 60 tablet 3   prenatal vitamin w/FE, FA (PRENATAL 1 + 1) 27-1 MG TABS tablet Take 1 tablet by mouth daily at 12 noon. 30 tablet 11   promethazine  (PHENERGAN ) 12.5 MG suppository Place 1-2 suppositories (12.5-25 mg total) rectally every 6 (six) hours. 42 each 0   promethazine  (PHENERGAN ) 25 MG tablet Take 1 tablet (25 mg total) by mouth every 6 (six) hours as needed for nausea or vomiting. 30 tablet 2   scopolamine  (TRANSDERM-SCOP) 1 MG/3DAYS Place 1 patch (1.5 mg total) onto the skin every 3 (three) days. (Patient not taking: Reported on 08/08/2023) 10 patch 12   zolpidem  (AMBIEN  CR) 12.5 MG CR tablet Take 1 tablet (12.5 mg total) by mouth at bedtime  as needed for sleep. (Patient not taking: Reported on 08/08/2023) 30 tablet 0   No current facility-administered medications on file prior to visit.     Exam   Vitals:   08/08/23 1516  BP: 107/69  Pulse: (!) 111  Weight: 150 lb 12.8 oz (68.4 kg)      Uterus:     Pelvic Exam: Perineum: no hemorrhoids, normal perineum   Vulva: normal external genitalia, no lesions   Vagina:  normal mucosa, normal discharge   Cervix: no lesions and normal, pap smear done.    Adnexa: normal adnexa and no mass, fullness, tenderness   Bony Pelvis: average  System: General: well-developed, well-nourished female in no acute distress   Skin: normal coloration and turgor, no rashes   Neurologic: oriented, normal, negative, normal mood   Extremities: normal strength, tone, and muscle mass, ROM of all joints is normal   HEENT  PERRLA, extraocular movement intact and sclera clear, anicteric   Mouth/Teeth mucous membranes moist, pharynx normal without lesions and dental hygiene good   Neck supple and no masses   Cardiovascular: regular rate and rhythm   Respiratory:  no respiratory distress, normal breath sounds   Abdomen: soft, non-tender; bowel sounds normal; no masses,  no organomegaly     Assessment:   Pregnancy: G2P1001 Patient Active Problem List   Diagnosis Date Noted   [redacted] weeks gestation of pregnancy 08/08/2023   Supervision of high risk pregnancy, antepartum 08/01/2023   Hypokalemia 07/29/2023   Ptyalism 07/29/2023   Hyperemesis 07/28/2023   Language barrier 03/06/2021     Plan:  1. [redacted] weeks gestation of pregnancy (Primary) 2. Supervision of high risk pregnancy, antepartum Initial labs drawn. Continue prenatal vitamins. Genetic Screening discussed, First trimester screen, Quad screen, and NIPS: ordered. Ultrasound discussed; fetal anatomic survey: Scheduled 10/06/2023. Problem list reviewed and updated.  Last pap 08/10/2021 NILM, repeat 07/2024  3. Language barrier [place] interpreter used for encounter  4. Hypokalemia K 3.3 on 6/29; on replacement 20 mEq BID Kdur; will recheck today with initial OB labs  5. Hyperemesis 6. Ptyalism Continue zofran  and robinol  The nature of [facility] Practice with multiple MDs and other Advanced Practice Providers was explained to patient; also emphasized that residents, students are part of our team. Routine obstetric precautions reviewed. Return in about 4 weeks (around 09/05/2023), or LROB.   Mardy Shropshire, MD FMOB Fellow, Faculty practice [facility], Center for [facility]

## 2023-08-09 ENCOUNTER — Telehealth: Payer: Self-pay | Admitting: Family Medicine

## 2023-08-09 LAB — CERVICOVAGINAL ANCILLARY ONLY
Bacterial Vaginitis (gardnerella): POSITIVE — AB
Candida Glabrata: NEGATIVE
Candida Vaginitis: POSITIVE — AB
Chlamydia: NEGATIVE
Comment: NEGATIVE
Comment: NEGATIVE
Comment: NEGATIVE
Comment: NEGATIVE
Comment: NEGATIVE
Comment: NORMAL
Neisseria Gonorrhea: NEGATIVE
Trichomonas: NEGATIVE

## 2023-08-09 LAB — CBC/D/PLT+RPR+RH+ABO+RUBIGG...
Antibody Screen: NEGATIVE
Basophils Absolute: 0 x10E3/uL (ref 0.0–0.2)
Basos: 0 %
EOS (ABSOLUTE): 0 x10E3/uL (ref 0.0–0.4)
Eos: 1 %
HCV Ab: NONREACTIVE
HIV Screen 4th Generation wRfx: NONREACTIVE
Hematocrit: 35.2 % (ref 34.0–46.6)
Hemoglobin: 11.8 g/dL (ref 11.1–15.9)
Hepatitis B Surface Ag: NEGATIVE
Immature Grans (Abs): 0.1 x10E3/uL (ref 0.0–0.1)
Immature Granulocytes: 1 %
Lymphocytes Absolute: 2.1 x10E3/uL (ref 0.7–3.1)
Lymphs: 29 %
MCH: 29.4 pg (ref 26.6–33.0)
MCHC: 33.5 g/dL (ref 31.5–35.7)
MCV: 88 fL (ref 79–97)
Monocytes Absolute: 0.7 x10E3/uL (ref 0.1–0.9)
Monocytes: 9 %
Neutrophils Absolute: 4.6 x10E3/uL (ref 1.4–7.0)
Neutrophils: 60 %
Platelets: 289 x10E3/uL (ref 150–450)
RBC: 4.01 x10E6/uL (ref 3.77–5.28)
RDW: 13.5 % (ref 11.7–15.4)
RPR Ser Ql: NONREACTIVE
Rh Factor: NEGATIVE
Rubella Antibodies, IGG: 7.54 {index} (ref >0.99)
WBC: 7.5 x10E3/uL (ref 3.4–10.8)

## 2023-08-09 LAB — HCV INTERPRETATION

## 2023-08-09 LAB — HEMOGLOBIN A1C
Est. average glucose Bld gHb Est-mCnc: 123 mg/dL
Hgb A1c MFr Bld: 5.9 % — ABNORMAL HIGH (ref 4.8–5.6)

## 2023-08-09 NOTE — Telephone Encounter (Signed)
 Hey this patient came in and filled out another ROI for her FMLA because she said she put the wrong info.

## 2023-08-10 LAB — URINE CULTURE, OB REFLEX

## 2023-08-10 LAB — CULTURE, OB URINE

## 2023-08-11 MED ORDER — FLUCONAZOLE 150 MG PO TABS: 150.0000 mg | ORAL_TABLET | Freq: Once | ORAL | 3 refills | Status: AC

## 2023-08-11 MED ORDER — METRONIDAZOLE 0.75 % VA GEL: 1.0000 | Freq: Every day | VAGINAL | 0 refills | Status: AC

## 2023-08-11 NOTE — Addendum Note (Signed)
 Addended by: Cerra Eisenhower R on: 08/11/2023 01:58 AM   Modules accepted: Orders

## 2023-08-15 ENCOUNTER — Ambulatory Visit: Payer: Self-pay | Admitting: Obstetrics and Gynecology

## 2023-08-16 LAB — HORIZON CUSTOM: REPORT SUMMARY: NEGATIVE

## 2023-08-17 DIAGNOSIS — Z0289 Encounter for other administrative examinations: Secondary | ICD-10-CM

## 2023-08-27 ENCOUNTER — Other Ambulatory Visit: Payer: Self-pay | Admitting: Family Medicine

## 2023-09-05 ENCOUNTER — Other Ambulatory Visit: Payer: Self-pay

## 2023-09-05 ENCOUNTER — Ambulatory Visit: Admitting: Obstetrics and Gynecology

## 2023-09-05 VITALS — BP 108/68 | HR 84 | Wt 148.0 lb

## 2023-09-05 DIAGNOSIS — O0990 Supervision of high risk pregnancy, unspecified, unspecified trimester: Secondary | ICD-10-CM

## 2023-09-05 DIAGNOSIS — Z3A15 15 weeks gestation of pregnancy: Secondary | ICD-10-CM | POA: Diagnosis not present

## 2023-09-05 DIAGNOSIS — Z603 Acculturation difficulty: Secondary | ICD-10-CM | POA: Diagnosis not present

## 2023-09-05 DIAGNOSIS — O0992 Supervision of high risk pregnancy, unspecified, second trimester: Secondary | ICD-10-CM | POA: Diagnosis not present

## 2023-09-05 DIAGNOSIS — R1110 Vomiting, unspecified: Secondary | ICD-10-CM

## 2023-09-05 DIAGNOSIS — E876 Hypokalemia: Secondary | ICD-10-CM

## 2023-09-05 DIAGNOSIS — Z758 Other problems related to medical facilities and other health care: Secondary | ICD-10-CM

## 2023-09-05 NOTE — Progress Notes (Signed)
   PRENATAL VISIT NOTE  Subjective:  Pamela Santos is a 33 y.o. G2P1001 at [redacted]w[redacted]d being seen today for ongoing prenatal care.  She is currently monitored for the following issues for this high-risk pregnancy and has Language barrier; Hyperemesis; Hypokalemia; Ptyalism; Supervision of high risk pregnancy, antepartum; and [redacted] weeks gestation of pregnancy on their problem list.  Patient reports constipation. Reports nausea is stable/okay with meds. Contractions: Not present. Vag. Bleeding: None.  Movement: Absent. Denies leaking of fluid.   The following portions of the patient's history were reviewed and updated as appropriate: allergies, current medications, past family history, past medical history, past social history, past surgical history and problem list.   Objective:    Vitals:   09/05/23 1522  BP: 108/68  Pulse: 84  Weight: 148 lb (67.1 kg)   Fetal Status:  Fetal Heart Rate (bpm): 155   Movement: Absent    General: Alert, oriented and cooperative. Patient is in no acute distress.  Skin: Skin is warm and dry. No rash noted.   Cardiovascular: Normal heart rate noted  Respiratory: Normal respiratory effort, no problems with respiration noted  Abdomen: Soft, gravid, appropriate for gestational age.  Pain/Pressure: Absent     Pelvic: Cervical exam deferred        Extremities: Normal range of motion.  Edema: None  Mental Status: Normal mood and affect. Normal behavior. Normal judgment and thought content.   Assessment and Plan:  Pregnancy: G2P1001 at [redacted]w[redacted]d  1. [redacted] weeks gestation of pregnancy (Primary)  2. Supervision of high risk pregnancy, antepartum A1c mildly elevated Rec early 2 hr GTT  3. Language barrier [place] interpretor used  4. Hypokalemia Recheck BMP today  5. Hyperemesis Doing well   Preterm labor symptoms and general obstetric precautions including but not limited to vaginal bleeding, contractions, leaking of fluid and fetal movement were  reviewed in detail with the patient. Please refer to After Visit Summary for other counseling recommendations.   Return in about 1 month (around 10/06/2023) for low OB.  Future Appointments  Date Time Provider Department Center  09/15/2023  9:20 AM [facility]  10/03/2023  3:15 PM Delores Nidia CROME, FNP [facility]  10/06/2023  1:00 PM [facility] PROVIDER 1 [facility]  10/06/2023  1:30 PM [facility] US3 [facility]  10/31/2023  3:15 PM Nicholaus Burnard HERO, MD [facility]    Burnard HERO Nicholaus, MD

## 2023-09-07 ENCOUNTER — Ambulatory Visit: Payer: Self-pay | Admitting: Obstetrics and Gynecology

## 2023-09-07 LAB — AFP, SERUM, OPEN SPINA BIFIDA
AFP MoM: 0.81
AFP Value: 26.4 ng/mL
Gest. Age on Collection Date: 15.1 wk
Maternal Age At EDD: 31.2 a
OSBR Risk 1 IN: 10000
Test Results:: NEGATIVE
Weight: 148 [lb_av]

## 2023-09-07 LAB — BASIC METABOLIC PANEL WITH GFR
BUN/Creatinine Ratio: 10 (ref 9–23)
BUN: 6 mg/dL (ref 6–20)
CO2: 21 mmol/L (ref 20–29)
Calcium: 9.8 mg/dL (ref 8.7–10.2)
Chloride: 102 mmol/L (ref 96–106)
Creatinine, Ser: 0.61 mg/dL (ref 0.57–1.00)
Glucose: 72 mg/dL (ref 70–99)
Potassium: 4.3 mmol/L (ref 3.5–5.2)
Sodium: 137 mmol/L (ref 134–144)
eGFR: 123 mL/min/1.73 (ref >59)

## 2023-09-12 ENCOUNTER — Other Ambulatory Visit: Payer: Self-pay

## 2023-09-12 DIAGNOSIS — R7309 Other abnormal glucose: Secondary | ICD-10-CM

## 2023-09-15 ENCOUNTER — Other Ambulatory Visit: Payer: Self-pay

## 2023-09-15 ENCOUNTER — Other Ambulatory Visit

## 2023-09-15 DIAGNOSIS — R7309 Other abnormal glucose: Secondary | ICD-10-CM | POA: Diagnosis not present

## 2023-09-16 ENCOUNTER — Ambulatory Visit: Payer: Self-pay | Admitting: Obstetrics and Gynecology

## 2023-09-16 LAB — GLUCOSE TOLERANCE, 2 HOURS W/ 1HR
Glucose, 1 hour: 86 mg/dL (ref 70–179)
Glucose, 2 hour: 79 mg/dL (ref 70–152)
Glucose, Fasting: 65 mg/dL — ABNORMAL LOW (ref 70–91)

## 2023-09-18 NOTE — Telephone Encounter (Signed)
-----   Message from [facility] sent at 09/16/2023  6:18 PM EDT ----- Can we notify normal early  glucose test, will repeat in third trimester ----- Message ----- From: Interface, [facility] Lab Results In Sent: 09/16/2023   7:37 AM EDT To: Nidia LITTIE Daring, FNP

## 2023-09-18 NOTE — Telephone Encounter (Signed)
 I called Patient with [employer] Interpreter # [phone] to her home number and left message we are calling with results and to call our office. I also called her mobile number and reached her. I informed her of results and recommendations. She voices understanding. Rock Skip PEAK

## 2023-10-03 ENCOUNTER — Ambulatory Visit: Admitting: Obstetrics and Gynecology

## 2023-10-03 ENCOUNTER — Other Ambulatory Visit: Payer: Self-pay

## 2023-10-03 VITALS — BP 109/80 | HR 103 | Wt 150.7 lb

## 2023-10-03 DIAGNOSIS — Z3A19 19 weeks gestation of pregnancy: Secondary | ICD-10-CM | POA: Diagnosis not present

## 2023-10-03 DIAGNOSIS — O0990 Supervision of high risk pregnancy, unspecified, unspecified trimester: Secondary | ICD-10-CM

## 2023-10-03 DIAGNOSIS — O0992 Supervision of high risk pregnancy, unspecified, second trimester: Secondary | ICD-10-CM

## 2023-10-03 DIAGNOSIS — R7309 Other abnormal glucose: Secondary | ICD-10-CM | POA: Diagnosis not present

## 2023-10-03 NOTE — Progress Notes (Signed)
   PRENATAL VISIT NOTE  Subjective:  Pamela Santos is a 33 y.o. G2P1001 at [redacted]w[redacted]d being seen today for ongoing prenatal care.  She is currently monitored for the following issues for this low-risk pregnancy and has Language barrier; Hyperemesis; Hypokalemia; Ptyalism; Supervision of high risk pregnancy, antepartum; and [redacted] weeks gestation of pregnancy on their problem list.  Patient reports no complaints.   . Vag. Bleeding: None.  Movement: Present. Denies leaking of fluid.   The following portions of the patient's history were reviewed and updated as appropriate: allergies, current medications, past family history, past medical history, past social history, past surgical history and problem list.   Objective:    Vitals:   10/03/23 1557  BP: 109/80  Pulse: (!) 103  Weight: 150 lb 11.2 oz (68.4 kg)    Fetal Status:  Fetal Heart Rate (bpm): 151   Movement: Present    General: Alert, oriented and cooperative. Patient is in no acute distress.  Skin: Skin is warm and dry. No rash noted.   Cardiovascular: Normal heart rate noted  Respiratory: Normal respiratory effort, no problems with respiration noted  Abdomen: Soft, gravid, appropriate for gestational age.  Pain/Pressure: Absent     Pelvic: Cervical exam deferred        Extremities: Normal range of motion.     Mental Status: Normal mood and affect. Normal behavior. Normal judgment and thought content.   Assessment and Plan:  Pregnancy: G2P1001 at [redacted]w[redacted]d 1. Supervision of high risk pregnancy, antepartum (Primary) BP and FHR normal Doing well, feeling regular movement    2. Elevated hemoglobin A1c A1c 5.9, normal early gtt, repeat third trimester  3. [redacted] weeks gestation of pregnancy Anatomy scan 9/5  Preterm labor symptoms and general obstetric precautions including but not limited to vaginal bleeding, contractions, leaking of fluid and fetal movement were reviewed in detail with the patient. Please refer to After Visit  Summary for other counseling recommendations.     Future Appointments  Date Time Provider Department Center  10/06/2023  1:00 PM [facility] PROVIDER 1 [facility]  10/06/2023  1:30 PM [facility] US3 [facility]  10/31/2023  3:15 PM Nicholaus Burnard HERO, MD [address] [facility]  11/24/2023  9:15 AM Ilean Norleen GAILS, MD [facility]  12/13/2023  8:15 AM W[facility]-GENERAL 1 [facility]  12/13/2023  8:40 AM [facility] LAB [facility]  01/03/2024  3:15 PM W[facility]-GENERAL 1 [facility]    Nidia Daring, FNP

## 2023-10-06 ENCOUNTER — Other Ambulatory Visit: Payer: Self-pay | Admitting: *Deleted

## 2023-10-06 ENCOUNTER — Ambulatory Visit: Attending: Obstetrics and Gynecology | Admitting: Obstetrics

## 2023-10-06 ENCOUNTER — Ambulatory Visit

## 2023-10-06 VITALS — BP 107/66

## 2023-10-06 DIAGNOSIS — Z3A19 19 weeks gestation of pregnancy: Secondary | ICD-10-CM

## 2023-10-06 DIAGNOSIS — K117 Disturbances of salivary secretion: Secondary | ICD-10-CM | POA: Diagnosis not present

## 2023-10-06 DIAGNOSIS — O211 Hyperemesis gravidarum with metabolic disturbance: Secondary | ICD-10-CM | POA: Diagnosis not present

## 2023-10-06 DIAGNOSIS — O99612 Diseases of the digestive system complicating pregnancy, second trimester: Secondary | ICD-10-CM | POA: Insufficient documentation

## 2023-10-06 DIAGNOSIS — O0990 Supervision of high risk pregnancy, unspecified, unspecified trimester: Secondary | ICD-10-CM | POA: Diagnosis not present

## 2023-10-06 DIAGNOSIS — O358XX0 Maternal care for other (suspected) fetal abnormality and damage, not applicable or unspecified: Secondary | ICD-10-CM

## 2023-10-06 DIAGNOSIS — O0992 Supervision of high risk pregnancy, unspecified, second trimester: Secondary | ICD-10-CM | POA: Insufficient documentation

## 2023-10-06 NOTE — Progress Notes (Signed)
 MFM Consult Note  Pamela Santos is currently at 19 weeks and 4 days.  Pamela Santos was seen for a detailed fetal anatomy scan.  Pamela Santos denies any significant past medical history. Her current pregnancy has been complicated by hyperemesis and ptyalism.  Pamela Santos had a cell free DNA test earlier in her pregnancy which indicated a low risk for trisomy 39, 58, and 13. A female fetus is predicted.   Sonographic findings Single intrauterine pregnancy at 19w 4d  Fetal cardiac activity:  Observed and appears normal. Presentation: Variable. The anatomic structures that were well seen appear normal without evidence of soft markers. Due to poor acoustic windows some structures remain suboptimally visualized. Fetal biometry shows the estimated fetal weight at the 84 percentile.  Amniotic fluid: Within normal limits.  MVP: 5.44 cm. Placenta: Fundal. Adnexa: No abnormality visualized. Cervical length: 4.5 cm.  The views of the fetal anatomy were limited today due to the fetal position.  The patient was informed that anomalies may be missed due to technical limitations. If the fetus is in a suboptimal position or maternal habitus is increased, visualization of the fetus in the maternal uterus may be impaired.  A follow-up exam was scheduled in 4 weeks to complete the views of the fetal anatomy and to assess the fetal growth.    The patient stated that all of her questions were answered today.  A total of 30 minutes was spent counseling and coordinating the care for this patient.  Greater than 50% of the time was spent in direct face-to-face contact.

## 2023-10-31 ENCOUNTER — Ambulatory Visit: Admitting: Obstetrics and Gynecology

## 2023-10-31 ENCOUNTER — Other Ambulatory Visit: Payer: Self-pay

## 2023-10-31 ENCOUNTER — Encounter: Payer: Self-pay | Admitting: Obstetrics and Gynecology

## 2023-10-31 VITALS — BP 109/69 | HR 103 | Wt 155.6 lb

## 2023-10-31 DIAGNOSIS — O0992 Supervision of high risk pregnancy, unspecified, second trimester: Secondary | ICD-10-CM | POA: Diagnosis not present

## 2023-10-31 DIAGNOSIS — Z3A23 23 weeks gestation of pregnancy: Secondary | ICD-10-CM

## 2023-10-31 DIAGNOSIS — O0990 Supervision of high risk pregnancy, unspecified, unspecified trimester: Secondary | ICD-10-CM

## 2023-10-31 NOTE — Progress Notes (Signed)
   PRENATAL VISIT NOTE  Subjective:  Pamela Santos is a 33 y.o. G2P1001 at [redacted]w[redacted]d being seen today for ongoing prenatal care.  She is currently monitored for the following issues for this low-risk pregnancy and has Language barrier; Hyperemesis; Hypokalemia; Ptyalism; Supervision of high risk pregnancy, antepartum; and [redacted] weeks gestation of pregnancy on their problem list.  Patient reports no complaints.  Contractions: Not present. Vag. Bleeding: None.  Movement: Present. Denies leaking of fluid.   The following portions of the patient's history were reviewed and updated as appropriate: allergies, current medications, past family history, past medical history, past social history, past surgical history and problem list.   Objective:    Vitals:   10/31/23 1551  BP: 109/69  Pulse: (!) 103  Weight: 155 lb 9.6 oz (70.6 kg)    Fetal Status:  Fetal Heart Rate (bpm): 144   Movement: Present    General: Alert, oriented and cooperative. Patient is in no acute distress.  Skin: Skin is warm and dry. No rash noted.   Cardiovascular: Normal heart rate noted  Respiratory: Normal respiratory effort, no problems with respiration noted  Abdomen: Soft, gravid, appropriate for gestational age.  Pain/Pressure: Absent     Pelvic: Cervical exam deferred        Extremities: Normal range of motion.  Edema: None  Mental Status: Normal mood and affect. Normal behavior. Normal judgment and thought content.   Assessment and Plan:  Pregnancy: G2P1001 at [redacted]w[redacted]d  1. Supervision of high risk pregnancy, antepartum (Primary) Reviewed next steps, provided reassurance that tylenol  is safe in pregnancy  2. [redacted] weeks gestation of pregnancy   Preterm labor symptoms and general obstetric precautions including but not limited to vaginal bleeding, contractions, leaking of fluid and fetal movement were reviewed in detail with the patient. Please refer to After Visit Summary for other counseling  recommendations.   Return in about 1 month (around 11/30/2023) for low OB, 3rd trim labs.  Future Appointments  Date Time Provider Department Center  11/06/2023  3:15 PM [facility] PROVIDER 1 [facility]  11/06/2023  3:30 PM [facility] US4 [facility]  11/27/2023  9:20 AM [facility] LAB [facility]  11/27/2023 10:55 AM Wallace Joesph LABOR, PA [facility]  12/13/2023  8:15 AM Oretta Berkland, Devon E, PA-C [facility]  01/03/2024  3:15 PM W[facility]-GENERAL 1 [facility]    Burnard CHRISTELLA Moats, MD

## 2023-11-06 ENCOUNTER — Other Ambulatory Visit: Payer: Self-pay | Admitting: *Deleted

## 2023-11-06 ENCOUNTER — Ambulatory Visit: Attending: Obstetrics and Gynecology

## 2023-11-06 ENCOUNTER — Ambulatory Visit (HOSPITAL_BASED_OUTPATIENT_CLINIC_OR_DEPARTMENT_OTHER): Admitting: Obstetrics and Gynecology

## 2023-11-06 VITALS — BP 103/63 | HR 94

## 2023-11-06 DIAGNOSIS — O358XX0 Maternal care for other (suspected) fetal abnormality and damage, not applicable or unspecified: Secondary | ICD-10-CM | POA: Diagnosis present

## 2023-11-06 DIAGNOSIS — O0990 Supervision of high risk pregnancy, unspecified, unspecified trimester: Secondary | ICD-10-CM | POA: Insufficient documentation

## 2023-11-06 DIAGNOSIS — O26892 Other specified pregnancy related conditions, second trimester: Secondary | ICD-10-CM | POA: Diagnosis not present

## 2023-11-06 DIAGNOSIS — Z3A24 24 weeks gestation of pregnancy: Secondary | ICD-10-CM | POA: Diagnosis not present

## 2023-11-06 DIAGNOSIS — O409XX0 Polyhydramnios, unspecified trimester, not applicable or unspecified: Secondary | ICD-10-CM

## 2023-11-06 DIAGNOSIS — Z6791 Unspecified blood type, Rh negative: Secondary | ICD-10-CM | POA: Diagnosis not present

## 2023-11-06 DIAGNOSIS — K117 Disturbances of salivary secretion: Secondary | ICD-10-CM

## 2023-11-06 DIAGNOSIS — O211 Hyperemesis gravidarum with metabolic disturbance: Secondary | ICD-10-CM

## 2023-11-06 DIAGNOSIS — O360120 Maternal care for anti-D [Rh] antibodies, second trimester, not applicable or unspecified: Secondary | ICD-10-CM

## 2023-11-06 DIAGNOSIS — O402XX0 Polyhydramnios, second trimester, not applicable or unspecified: Secondary | ICD-10-CM

## 2023-11-06 DIAGNOSIS — O99612 Diseases of the digestive system complicating pregnancy, second trimester: Secondary | ICD-10-CM

## 2023-11-06 NOTE — Progress Notes (Signed)
 Maternal-Fetal Medicine Consultation  Name: Pamela Santos  MRN: [id number]  GA: [id number] [redacted]w[redacted]d   Patient is here for completion of fetal anatomy. Ultrasound Normal fetal growth.  Mild polyhydramnios is seen. Fetal anatomical survey was completed and appears normal.  I counseled the patient of the following: Mild polyhydramnios I explained the finding and that it can be associated with gestational diabetes.  I encouraged her to screen for gestational diabetes.  In the absence of diabetes, mild polyhydramnios is not associated with any fetal adverse outcomes.  Rh Negative Mother Patient's blood type is O negative.  She has cell free fetal DNA screening (panorama).  We can obtain fetal Rh antigen status from [facility] with no additional charge.  I counseled the patient that studies have shown that cell free fetal DNA screening detects 100% of Rh-positive fetuses.  If the fetus is Rh negative, RhoGAM may not be given during pregnancy. Patient requested fetal Rh antigen status.  Our genetic counselor requested determination of Rh antigen status.  We will communicate the results to the patient and scan a copy into EPIC.  Recommendations - An appointment was made for her to return in 8 weeks for fetal growth assessment.     Consultation including face-to-face (more than 50%) counseling 20 minutes.

## 2023-11-07 LAB — PANORAMA PRENATAL TEST FULL PANEL:PANORAMA TEST PLUS 5 ADDITIONAL MICRODELETIONS: FETAL FRACTION: 11.9

## 2023-11-13 ENCOUNTER — Telehealth: Payer: Self-pay

## 2023-11-13 NOTE — Telephone Encounter (Signed)
 Attempted to call patient with updated Panorama RhD results. Left message with callback number.  Patient is RhD negative, and Panorama reports fetus is RhD positive. Therefore, RhoGAM to be administered at 28w.

## 2023-11-21 ENCOUNTER — Encounter: Admitting: Physician Assistant

## 2023-11-23 ENCOUNTER — Other Ambulatory Visit: Payer: Self-pay | Admitting: *Deleted

## 2023-11-23 DIAGNOSIS — O0990 Supervision of high risk pregnancy, unspecified, unspecified trimester: Secondary | ICD-10-CM

## 2023-11-24 ENCOUNTER — Encounter: Admitting: Family Medicine

## 2023-11-27 ENCOUNTER — Ambulatory Visit (INDEPENDENT_AMBULATORY_CARE_PROVIDER_SITE_OTHER): Admitting: Family Medicine

## 2023-11-27 ENCOUNTER — Other Ambulatory Visit

## 2023-11-27 ENCOUNTER — Other Ambulatory Visit: Payer: Self-pay

## 2023-11-27 VITALS — BP 109/68 | HR 109 | Wt 156.9 lb

## 2023-11-27 DIAGNOSIS — O0990 Supervision of high risk pregnancy, unspecified, unspecified trimester: Secondary | ICD-10-CM

## 2023-11-27 DIAGNOSIS — O0992 Supervision of high risk pregnancy, unspecified, second trimester: Secondary | ICD-10-CM

## 2023-11-27 DIAGNOSIS — K5900 Constipation, unspecified: Secondary | ICD-10-CM | POA: Diagnosis not present

## 2023-11-27 DIAGNOSIS — K649 Unspecified hemorrhoids: Secondary | ICD-10-CM | POA: Diagnosis not present

## 2023-11-27 DIAGNOSIS — Z6791 Unspecified blood type, Rh negative: Secondary | ICD-10-CM

## 2023-11-27 DIAGNOSIS — Z3A27 27 weeks gestation of pregnancy: Secondary | ICD-10-CM

## 2023-11-27 DIAGNOSIS — O26892 Other specified pregnancy related conditions, second trimester: Secondary | ICD-10-CM

## 2023-11-27 MED ORDER — POLYETHYLENE GLYCOL 3350 17 GM/SCOOP PO POWD: 17.0000 g | Freq: Every day | ORAL | 2 refills | Status: AC | PRN

## 2023-11-27 MED ORDER — LIDOCAINE-HYDROCORTISONE ACE 3-0.5 % EX CREA: 1.0000 | TOPICAL_CREAM | Freq: Two times a day (BID) | CUTANEOUS | 0 refills | Status: AC

## 2023-11-27 NOTE — Progress Notes (Signed)
   PRENATAL VISIT NOTE  Subjective:  Pamela Santos is a 33 y.o. G2P1001 at [redacted]w[redacted]d being seen today for ongoing prenatal care.  She is currently monitored for the following issues for this low-risk pregnancy and has Rh negative state in antepartum period; Hyperemesis; Ptyalism; and Supervision of high risk pregnancy, antepartum on their problem list.  Patient reports constipation and hemorrhoids. She has been using MiraLax prn without relief.  Contractions: Not present. Vag. Bleeding: None.  Movement: Present. Denies leaking of fluid.   The following portions of the patient's history were reviewed and updated as appropriate: allergies, current medications, past family history, past medical history, past social history, past surgical history and problem list.   Objective:    Vitals:   11/27/23 0934  BP: 109/68  Pulse: (!) 109  Weight: 156 lb 14.4 oz (71.2 kg)    Fetal Status:  Fetal Heart Rate (bpm): 155   Movement: Present    General: Alert, oriented and cooperative. Patient is in no acute distress.  Skin: Skin is warm and dry. No rash noted.   Cardiovascular: Normal heart rate noted  Respiratory: Normal respiratory effort, no problems with respiration noted  Abdomen: Soft, gravid, appropriate for gestational age.  Pain/Pressure: Absent     Pelvic: Cervical exam deferred        Extremities: Normal range of motion.  Edema: None  Mental Status: Normal mood and affect. Normal behavior. Normal judgment and thought content.   Assessment and Plan:  Pregnancy: G2P1001 at [redacted]w[redacted]d 1. Supervision of high risk pregnancy, antepartum (Primary) Prenatal course reviewed BP, HR, FHR within normal limits Feeling regular FM   2. Constipation, unspecified constipation type Recommend increased PO intake of water MiraLax 1-2 times daily   3. Hemorrhoids, unspecified hemorrhoid type Cream sent to pharmacy  4. Rh negative state in antepartum period Will return next week (28 weeks) for  Rhogam  5. [redacted] weeks gestation of pregnancy Labs and GTT today  Discussed recommendation for Tdap in each pregnancy. Agreeable to Tdap next office visit.  Preterm labor symptoms and general obstetric precautions including but not limited to vaginal bleeding, contractions, leaking of fluid and fetal movement were reviewed in detail with the patient. Please refer to After Visit Summary for other counseling recommendations.   Return in about 2 weeks (around 12/11/2023) for LOB.  Future Appointments  Date Time Provider Department Center  11/27/2023 10:55 AM Wallace Joesph LABOR, [place] [facility]  12/13/2023  8:15 AM Nicholaus Jorene FORBES DEVONNA [facility]  01/01/2024  1:15 PM [facility] PROVIDER 1 [facility]  01/01/2024  1:30 PM [facility] US2 [facility]  01/03/2024  3:15 PM Cleatus Moccasin, MD [facility]    Joesph LABOR Wallace, PA

## 2023-11-28 ENCOUNTER — Ambulatory Visit: Payer: Self-pay | Admitting: Family Medicine

## 2023-11-28 LAB — CBC
Hematocrit: 36.3 % (ref 34.0–46.6)
Hemoglobin: 11.6 g/dL (ref 11.1–15.9)
MCH: 28.4 pg (ref 26.6–33.0)
MCHC: 32 g/dL (ref 31.5–35.7)
MCV: 89 fL (ref 79–97)
Platelets: 226 x10E3/uL (ref 150–450)
RBC: 4.09 x10E6/uL (ref 3.77–5.28)
RDW: 13.6 % (ref 11.7–15.4)
WBC: 6.2 x10E3/uL (ref 3.4–10.8)

## 2023-11-28 LAB — RPR: RPR Ser Ql: NONREACTIVE

## 2023-11-28 LAB — GLUCOSE TOLERANCE, 2 HOURS W/ 1HR
Glucose, 1 hour: 102 mg/dL (ref 70–179)
Glucose, 2 hour: 95 mg/dL (ref 70–152)
Glucose, Fasting: 79 mg/dL (ref 70–91)

## 2023-11-28 LAB — HIV ANTIBODY (ROUTINE TESTING W REFLEX): HIV Screen 4th Generation wRfx: NONREACTIVE

## 2023-11-28 LAB — ANTIBODY SCREEN: Antibody Screen: NEGATIVE

## 2023-12-06 ENCOUNTER — Ambulatory Visit: Admitting: *Deleted

## 2023-12-06 ENCOUNTER — Other Ambulatory Visit: Payer: Self-pay

## 2023-12-06 VITALS — BP 113/69 | HR 91 | Ht 63.0 in | Wt 160.7 lb

## 2023-12-06 DIAGNOSIS — O26899 Other specified pregnancy related conditions, unspecified trimester: Secondary | ICD-10-CM

## 2023-12-06 DIAGNOSIS — Z2913 Encounter for prophylactic Rho(D) immune globulin: Secondary | ICD-10-CM

## 2023-12-06 DIAGNOSIS — Z3A28 28 weeks gestation of pregnancy: Secondary | ICD-10-CM

## 2023-12-06 DIAGNOSIS — Z6791 Unspecified blood type, Rh negative: Secondary | ICD-10-CM

## 2023-12-06 DIAGNOSIS — O26893 Other specified pregnancy related conditions, third trimester: Secondary | ICD-10-CM

## 2023-12-06 MED ORDER — RHO D IMMUNE GLOBULIN 1500 UNIT/2ML IJ SOSY
300.0000 ug | PREFILLED_SYRINGE | Freq: Once | INTRAMUSCULAR | Status: AC
  Administered 2023-12-06: 300 ug via INTRAMUSCULAR

## 2023-12-06 NOTE — Progress Notes (Signed)
 Pt is currently [redacted]w[redacted]d and presents for Rhogam injection which was administered without difficulty. She will return as scheduled on 11/12 for prenatal visit.

## 2023-12-13 ENCOUNTER — Ambulatory Visit: Admitting: Physician Assistant

## 2023-12-13 ENCOUNTER — Other Ambulatory Visit: Payer: Self-pay

## 2023-12-13 ENCOUNTER — Other Ambulatory Visit

## 2023-12-13 VITALS — BP 96/63 | HR 102 | Wt 159.2 lb

## 2023-12-13 DIAGNOSIS — O0990 Supervision of high risk pregnancy, unspecified, unspecified trimester: Secondary | ICD-10-CM

## 2023-12-13 DIAGNOSIS — O0993 Supervision of high risk pregnancy, unspecified, third trimester: Secondary | ICD-10-CM

## 2023-12-13 DIAGNOSIS — O26893 Other specified pregnancy related conditions, third trimester: Secondary | ICD-10-CM

## 2023-12-13 DIAGNOSIS — Z0289 Encounter for other administrative examinations: Secondary | ICD-10-CM

## 2023-12-13 DIAGNOSIS — Z23 Encounter for immunization: Secondary | ICD-10-CM

## 2023-12-13 DIAGNOSIS — Z6791 Unspecified blood type, Rh negative: Secondary | ICD-10-CM | POA: Diagnosis not present

## 2023-12-13 DIAGNOSIS — O409XX0 Polyhydramnios, unspecified trimester, not applicable or unspecified: Secondary | ICD-10-CM | POA: Insufficient documentation

## 2023-12-13 DIAGNOSIS — K5900 Constipation, unspecified: Secondary | ICD-10-CM | POA: Diagnosis not present

## 2023-12-13 DIAGNOSIS — Z3A29 29 weeks gestation of pregnancy: Secondary | ICD-10-CM | POA: Diagnosis not present

## 2023-12-13 NOTE — Progress Notes (Addendum)
 PRENATAL VISIT NOTE  Subjective:  Pamela Santos is a 33 y.o. G2P1001 at [redacted]w[redacted]d being seen today for ongoing prenatal care.  She is currently monitored for the following issues for this high-risk pregnancy and has Rh negative state in antepartum period; Hyperemesis; Ptyalism; Supervision of high risk pregnancy, antepartum; Constipation; and Polyhydramnios on their problem list.  Patient reports no complaints.  Contractions: Not present. Vag. Bleeding: None.  Movement: Present. Denies leaking of fluid.   The following portions of the patient's history were reviewed and updated as appropriate: allergies, current medications, past family history, past medical history, past social history, past surgical history and problem list.   Objective:   Vitals:   12/13/23 0828  BP: 96/63  Pulse: (!) 102  Weight: 159 lb 4 oz (72.2 kg)    Fetal Status:  Fetal Heart Rate (bpm): 148 Fundal Height: 29 cm Movement: Present    General: Alert, oriented and cooperative. Patient is in no acute distress.  Skin: Skin is warm and dry. No rash noted.   Cardiovascular: Normal heart rate noted  Respiratory: Normal respiratory effort, no problems with respiration noted  Abdomen: Soft, gravid, appropriate for gestational age.  Pain/Pressure: Absent     Pelvic: Cervical exam deferred        Extremities: Normal range of motion.  Edema: None  Mental Status: Normal mood and affect. Normal behavior. Normal judgment and thought content.      08/08/2023    3:38 PM 10/20/2022    1:58 PM 10/25/2021    4:18 PM  Depression screen PHQ 2/9  Decreased Interest 2 0 0  Down, Depressed, Hopeless 1 0 0  PHQ - 2 Score 3 0 0  Altered sleeping  0 0  Tired, decreased energy 2 0 0  Change in appetite 2 0 0  Feeling bad or failure about yourself  0 0 0  Trouble concentrating 1 0 0  Moving slowly or fidgety/restless 1 0 0  Suicidal thoughts 0 0 0  PHQ-9 Score  0  0      Data saved with a previous flowsheet row  definition        08/08/2023    3:38 PM 10/20/2022    1:58 PM 10/25/2021    4:19 PM 05/28/2021    8:48 AM  GAD 7 : Generalized Anxiety Score  Nervous, Anxious, on Edge 0 0 0 1  Control/stop worrying 0 0 0 0  Worry too much - different things 1 0 0 0  Trouble relaxing 1 0 0 1  Restless 0 0 0 0  Easily annoyed or irritable 0 0 0 0  Afraid - awful might happen 0 0 0 0  Total GAD 7 Score 2 0 0 2    Assessment and Plan:  Pregnancy: G2P1001 at [redacted]w[redacted]d  1. Supervision of high risk pregnancy, antepartum (Primary) Patient doing well, feeling regular fetal movement BP, FHR, FH appropriate   2. [redacted] weeks gestation of pregnancy Anticipatory guidance about next visits/weeks of pregnancy given.  - Tdap vaccine greater than or equal to 7yo IM  3. Rh negative state in antepartum period Rhogam given 12/06/23  4. Constipation, unspecified constipation type Patient reports improved on MiraLax daily  5. Polyhydramnios Normal 2 hr GTT MFM following   Preterm labor symptoms and general obstetric precautions including but not limited to vaginal bleeding, contractions, leaking of fluid and fetal movement were reviewed in detail with the patient. Please refer to After Visit Summary for other counseling  recommendations.   Return in about 2 weeks (around 12/27/2023).  Future Appointments  Date Time Provider Department Center  01/01/2024  1:15 PM [facility] PROVIDER 1 [facility]  01/01/2024  1:30 PM [facility] US2 [facility]  01/03/2024  3:15 PM Cleatus Moccasin, MD [facility]    Jorene FORBES Moats, PA-C

## 2023-12-13 NOTE — Patient Instructions (Signed)
 Hey Kelsey, it was great seeing you today! Try up to 300 mg of magnesium in the evening for improved sleep.

## 2023-12-22 ENCOUNTER — Other Ambulatory Visit: Payer: Self-pay

## 2023-12-22 ENCOUNTER — Inpatient Hospital Stay (HOSPITAL_COMMUNITY)
Admission: AD | Admit: 2023-12-22 | Discharge: 2023-12-22 | Disposition: A | Discharge status: Home / Self Care | Attending: Obstetrics & Gynecology | Admitting: Obstetrics & Gynecology

## 2023-12-22 ENCOUNTER — Encounter (HOSPITAL_COMMUNITY): Payer: Self-pay | Admitting: Obstetrics & Gynecology

## 2023-12-22 DIAGNOSIS — O0990 Supervision of high risk pregnancy, unspecified, unspecified trimester: Secondary | ICD-10-CM

## 2023-12-22 DIAGNOSIS — Z3689 Encounter for other specified antenatal screening: Secondary | ICD-10-CM | POA: Diagnosis not present

## 2023-12-22 DIAGNOSIS — O99613 Diseases of the digestive system complicating pregnancy, third trimester: Secondary | ICD-10-CM | POA: Diagnosis not present

## 2023-12-22 DIAGNOSIS — O0993 Supervision of high risk pregnancy, unspecified, third trimester: Secondary | ICD-10-CM | POA: Insufficient documentation

## 2023-12-22 DIAGNOSIS — Z3A30 30 weeks gestation of pregnancy: Secondary | ICD-10-CM | POA: Insufficient documentation

## 2023-12-22 DIAGNOSIS — R102 Pelvic and perineal pain unspecified side: Secondary | ICD-10-CM | POA: Diagnosis not present

## 2023-12-22 DIAGNOSIS — O26893 Other specified pregnancy related conditions, third trimester: Secondary | ICD-10-CM | POA: Insufficient documentation

## 2023-12-22 DIAGNOSIS — K117 Disturbances of salivary secretion: Secondary | ICD-10-CM

## 2023-12-22 DIAGNOSIS — O26899 Other specified pregnancy related conditions, unspecified trimester: Secondary | ICD-10-CM

## 2023-12-22 LAB — URINALYSIS, ROUTINE W REFLEX MICROSCOPIC
Bilirubin Urine: NEGATIVE
Glucose, UA: NEGATIVE mg/dL
Hgb urine dipstick: NEGATIVE
Ketones, ur: 15 mg/dL — AB
Nitrite: NEGATIVE
Protein, ur: NEGATIVE mg/dL
Specific Gravity, Urine: 1.015 (ref 1.005–1.030)
pH: 7 (ref 5.0–8.0)

## 2023-12-22 LAB — WET PREP, GENITAL
Clue Cells Wet Prep HPF POC: NONE SEEN
Sperm: NONE SEEN
Trich, Wet Prep: NONE SEEN
WBC, Wet Prep HPF POC: <10 (ref <10)
Yeast Wet Prep HPF POC: NONE SEEN

## 2023-12-22 LAB — URINALYSIS, MICROSCOPIC (REFLEX): RBC / HPF: NONE SEEN RBC/hpf (ref 0–5)

## 2023-12-22 NOTE — MAU Provider Note (Signed)
 S Pamela Santos is a 33 y.o. G2P1001 patient who presents to MAU today with complaint of patient is 30 weeks and 4 days reporting vaginal pressure that started last night after she reports losing her mucous plug.  She denies any vaginal bleeding, leaking of fluid, vaginal discharge, and reports no contractions.  She offers no urinary signs or symptoms and denies any abdominal pain.  Her prenatal care is at [facility] for women complicated by Rh- state, hyperemesis, constipation, newly diagnosed polyhydramnios on last ultrasound which was October 07/21/2023.  She has follow-up with MFM for this    O BP 103/64   Pulse (!) 107   Temp 98.9 F (37.2 C) (Oral)   Resp 17   Ht 5' 3 (1.6 m)   Wt 71.9 kg   LMP 05/23/2023 (Exact Date)   SpO2 99%   BMI 28.09 kg/m  Physical Exam Vitals and nursing note reviewed. Exam conducted with a chaperone present.  Constitutional:      General: She is not in acute distress.    Appearance: Normal appearance. She is normal weight. She is not ill-appearing.  HENT:     Head: Normocephalic.     Nose: Nose normal.     Mouth/Throat:     Mouth: Mucous membranes are moist.  Cardiovascular:     Rate and Rhythm: Normal rate.  Pulmonary:     Effort: Pulmonary effort is normal.  Abdominal:     Palpations: Abdomen is soft.  Musculoskeletal:     Cervical back: Normal range of motion.  Skin:    General: Skin is warm.  Neurological:     Mental Status: She is alert and oriented to person, place, and time.  Psychiatric:        Mood and Affect: Mood normal.        Behavior: Behavior normal.     Constitutional: Well-developed, well-nourished female in no acute distress.  Cardiovascular: normal rate & rhythm, warm and well-perfused Respiratory: normal effort, no problems with respiration noted GI: Abd soft, non-tender, gravid MS: Extremities nontender, no edema, normal ROM Neurologic: Alert and oriented x 4.  GU: no CVA tenderness Pelvic:  Chaperoned by Ronal Eriksson RN SSE: No pooling, no evidence of vaginal bleeding, cervix visually closed , swabs obtained   Fetal Tracing: NST Reactive for GA Baseline: 140-145 Variability: moderate  Accelerations: present Decelerations: absent Toco: quite   MDM  HIGH  Prenatal chart reviewed Physical exam performed with pelvic Vaginal cultures obtained ( Wet prep negative/GC pending at discharge) UA: Pending at discharge ( Patient desires discharge and requests a call or note via My Chart if results are abnormal) NST for gestational age and fetal reassurance ( Reactive NST) Plan for discharge at this time and maternity support band encouraged for likely physiological MSK pain /pressure in pregnancy   Orders Placed This Encounter  Procedures   Wet prep, genital    Standing Status:   Standing    Number of Occurrences:   1   Urinalysis, Routine w reflex microscopic -Urine, Clean Catch    Standing Status:   Standing    Number of Occurrences:   1    Specimen Source:   Urine, Clean Catch [76]   Urinalysis, Microscopic (reflex)    Standing Status:   Standing    Number of Occurrences:   1   Discharge patient Discharge disposition: 01-Home or Self Care; Discharge patient date: 12/22/2023    Standing Status:   Standing  Number of Occurrences:   1    Discharge disposition:   01-Home or Self Care [1]    Discharge patient date:   12/22/2023      Results for orders placed or performed during the hospital encounter of 12/22/23 (from the past 24 hours)  Urinalysis, Routine w reflex microscopic -Urine, Clean Catch     Status: Abnormal   Collection Time: 12/22/23  7:55 AM  Result Value Ref Range   Color, Urine YELLOW YELLOW   APPearance CLEAR CLEAR   Specific Gravity, Urine 1.015 1.005 - 1.030   pH 7.0 5.0 - 8.0   Glucose, UA NEGATIVE NEGATIVE mg/dL   Hgb urine dipstick NEGATIVE NEGATIVE   Bilirubin Urine NEGATIVE NEGATIVE   Ketones, ur 15 (A) NEGATIVE mg/dL   Protein, ur NEGATIVE  NEGATIVE mg/dL   Nitrite NEGATIVE NEGATIVE   Leukocytes,Ua SMALL (A) NEGATIVE  Urinalysis, Microscopic (reflex)     Status: Abnormal   Collection Time: 12/22/23  7:55 AM  Result Value Ref Range   RBC / HPF NONE SEEN 0 - 5 RBC/hpf   WBC, UA 0-5 0 - 5 WBC/hpf   Bacteria, UA RARE (A) NONE SEEN   Squamous Epithelial / HPF 0-5 0 - 5 /HPF  Wet prep, genital     Status: None   Collection Time: 12/22/23  8:04 AM  Result Value Ref Range   Yeast Wet Prep HPF POC NONE SEEN NONE SEEN   Trich, Wet Prep NONE SEEN NONE SEEN   Clue Cells Wet Prep HPF POC NONE SEEN NONE SEEN   WBC, Wet Prep HPF POC <10 <10   Sperm NONE SEEN      I have reviewed the patient chart and performed the physical exam . I have ordered & interpreted the lab results and reviewed and interpreted the NST Medications ordered as stated below.  A/P as described below.  Counseling and education provided and patient agreeable  with plan as described below. Verbalized understanding.    ASSESSMENT Medical screening exam complete Ptyalism  Supervision of high risk pregnancy, antepartum  [redacted] weeks gestation of pregnancy  Pelvic pressure in pregnancy, antepartum  NST (non-stress test) reactive on fetal surveillance    PLAN Future Appointments  Date Time Provider Department Center  01/01/2024  1:15 PM [facility] PROVIDER 1 [facility]  01/01/2024  1:30 PM [facility] US2 [facility]  01/03/2024  3:15 PM Cleatus Moccasin, MD [facility]    Discharge from MAU in stable condition  See AVS for full description of educational information and instructions provided to the patient at time of discharge  Warning signs for worsening condition that would warrant emergency follow-up discussed Patient may return to MAU as needed   Littie Olam LABOR, NP 12/22/2023 7:44 PM   This chart was dictated using voice recognition software, Dragon. Despite the best efforts of this provider to proofread and correct errors, errors may still occur which can  change documentation meaning.

## 2023-12-22 NOTE — MAU Note (Signed)
 Pamela Santos is a 33 y.o. at [redacted]w[redacted]d here in MAU reporting: vaginal/pelvic pressure starting last night after losing mucous plug. She reports +FMs, Denies LOF, VB, regular contractions, blurry vision, headaches, peripheral edema, or RUQ pain.   LMP: na Onset of complaint: last night Pain score: 4-5/10 Vitals:   12/22/23 0744  BP: 109/71  Pulse: 99  Resp: 17  Temp: 98.9 F (37.2 C)  SpO2: 99%     FHT: to room   Lab orders placed from triage: ua

## 2023-12-22 NOTE — Discharge Instructions (Signed)
 I recommend that you purchase a maternity support band which will help alleviate some of the symptoms you are having from pelvic pressure.  You can purchase this at [employer] or [employer].  We do not have recommendations for any specific type of maternity band.  Please choose what you feel you would be comfortable with and follow-up with your OB provider as scheduled

## 2023-12-25 LAB — GC/CHLAMYDIA PROBE AMP (~~LOC~~) NOT AT ARMC
Chlamydia: NEGATIVE
Comment: NEGATIVE
Comment: NORMAL
Neisseria Gonorrhea: NEGATIVE

## 2024-01-01 ENCOUNTER — Ambulatory Visit: Attending: Obstetrics and Gynecology

## 2024-01-01 ENCOUNTER — Ambulatory Visit: Admitting: Maternal & Fetal Medicine

## 2024-01-01 DIAGNOSIS — O403XX0 Polyhydramnios, third trimester, not applicable or unspecified: Secondary | ICD-10-CM | POA: Diagnosis not present

## 2024-01-01 DIAGNOSIS — Z3A32 32 weeks gestation of pregnancy: Secondary | ICD-10-CM | POA: Diagnosis not present

## 2024-01-01 DIAGNOSIS — O211 Hyperemesis gravidarum with metabolic disturbance: Secondary | ICD-10-CM

## 2024-01-01 DIAGNOSIS — O360130 Maternal care for anti-D [Rh] antibodies, third trimester, not applicable or unspecified: Secondary | ICD-10-CM | POA: Diagnosis not present

## 2024-01-01 DIAGNOSIS — O409XX0 Polyhydramnios, unspecified trimester, not applicable or unspecified: Secondary | ICD-10-CM | POA: Diagnosis present

## 2024-01-01 NOTE — Progress Notes (Signed)
   Patient information  Patient Name: Pamela Santos  Patient MRN:   [id number]  Referring practice: MFM Referring Provider: [facility])  Problem List   Patient Active Problem List   Diagnosis Date Noted   Polyhydramnios 12/13/2023   Constipation 11/27/2023   Supervision of high risk pregnancy, antepartum 08/01/2023   Ptyalism 07/29/2023   Hyperemesis 07/28/2023   Rh negative state in antepartum period 03/06/2021    Maternal Fetal medicine Consult  Pamela Santos is a 33 y.o. G2P1001 at [redacted]w[redacted]d here for ultrasound and consultation. Pamela Santos is doing well today with no acute concerns. Today we focused on the following:   The patient was here for a follow-up ultrasound for polyhydramnios. Today's ultrasounds shows normal fetal growth with an estimated fetal weight of  and normal amniotic fluid. There was also appropriate fetal movement and tone for this gestational age.    The patient had time to ask questions that were answered to her satisfaction.  She verbalized understanding and agrees to proceed with the plan below.  Standard OB precautions were given to the patient when appropriate based on the gestational age (fetal kick counts, importance of attending prenatal visits, any antenatal testing or ultrasounds that are scheduled as well as monitoring for signs and symptoms that is labor, vaginal bleeding, loss of amniotic fluid, or decreased fetal movement).  Recommendations -No further ultrasounds are recommended at this time based on the current indications. If future indications arise (e.g. size/date discrepancy on fundal height, gestational diabetes or hypertension) and an ultrasound is to be desired at our MFM office, please send a referral.   I spent 20 minutes reviewing the patients chart, including labs and images as well as counseling the patient about her medical conditions. Greater than 50% of the time was spent in  direct face-to-face patient counseling.  Delora Smaller  MFM, [place]   01/01/2024  1:58 PM   Review of Systems: A review of systems was performed and was negative except per HPI   Vitals and Physical Exam    01/01/2024    1:20 PM 12/22/2023    8:02 AM 12/22/2023    7:44 AM  Vitals with BMI  Height   5' 3  Weight   158 lbs 10 oz  BMI   28.1  Systolic 99 103 109  Diastolic 59 64 71  Pulse 92 107 99    Sitting comfortably on the sonogram table Nonlabored breathing Normal rate and rhythm Abdomen is nontender  Past pregnancies OB History  Gravida Para Term Preterm AB Living  2 1 1   1   SAB IAB Ectopic Multiple Live Births     1 1    # Outcome Date GA Lbr Len/2nd Weight Sex Type Anes PTL Lv  2A Gravida           2B Current           1 Term 06/10/21 [redacted]w[redacted]d / 00:45 6 lb 7.7 oz (2.94 kg) M Vag-Spont None  LIV     Future Appointments  Date Time Provider Department Center  01/03/2024  3:15 PM Cleatus Moccasin, MD [facility]

## 2024-01-03 ENCOUNTER — Encounter: Payer: Self-pay | Admitting: Obstetrics and Gynecology

## 2024-01-03 ENCOUNTER — Ambulatory Visit: Admitting: Obstetrics and Gynecology

## 2024-01-03 ENCOUNTER — Other Ambulatory Visit: Payer: Self-pay

## 2024-01-03 VITALS — BP 98/69 | HR 103 | Wt 160.0 lb

## 2024-01-03 DIAGNOSIS — O26893 Other specified pregnancy related conditions, third trimester: Secondary | ICD-10-CM | POA: Diagnosis not present

## 2024-01-03 DIAGNOSIS — Z6791 Unspecified blood type, Rh negative: Secondary | ICD-10-CM

## 2024-01-03 DIAGNOSIS — Z3A32 32 weeks gestation of pregnancy: Secondary | ICD-10-CM | POA: Diagnosis not present

## 2024-01-03 DIAGNOSIS — O0993 Supervision of high risk pregnancy, unspecified, third trimester: Secondary | ICD-10-CM | POA: Diagnosis not present

## 2024-01-03 DIAGNOSIS — O0990 Supervision of high risk pregnancy, unspecified, unspecified trimester: Secondary | ICD-10-CM

## 2024-01-03 DIAGNOSIS — Z2911 Encounter for prophylactic immunotherapy for respiratory syncytial virus (RSV): Secondary | ICD-10-CM | POA: Diagnosis not present

## 2024-01-03 NOTE — Progress Notes (Addendum)
   PRENATAL VISIT NOTE  Subjective:  Pamela Santos is a 33 y.o. G2P1001 at [redacted]w[redacted]d being seen today for ongoing prenatal care.  She is currently monitored for the following issues for this low-risk pregnancy and has Rh negative state in antepartum period and Supervision of high risk pregnancy, antepartum on their problem list.  Patient reports backache.  Contractions: Not present. Vag. Bleeding: None.  Movement: Present. Denies leaking of fluid.   The following portions of the patient's history were reviewed and updated as appropriate: allergies, current medications, past family history, past medical history, past social history, past surgical history and problem list.   Objective:   Vitals:   01/03/24 1512  BP: 98/69  Pulse: (!) 103  Weight: 160 lb (72.6 kg)    Fetal Status:  Fetal Heart Rate (bpm): 152 Fundal Height: 32 cm Movement: Present    General: Alert, oriented and cooperative. Patient is in no acute distress.  Skin: Skin is warm and dry. No rash noted.   Cardiovascular: Normal heart rate noted  Respiratory: Normal respiratory effort, no problems with respiration noted  Abdomen: Soft, gravid, appropriate for gestational age.  Pain/Pressure: Absent     Pelvic: Cervical exam deferred        Extremities: Normal range of motion.  Edema: None  Mental Status: Normal mood and affect. Normal behavior. Normal judgment and thought content.   Assessment and Plan:  Pregnancy: G2P1001 at [redacted]w[redacted]d 1. Supervision of high risk pregnancy, antepartum (Primary) Discussed RSV vaccine. Pt accepts.   2. Pregnancy with 32 completed weeks gestation  3. Rh negative state in antepartum period S/p Rhogam  Preterm labor symptoms and general obstetric precautions including but not limited to vaginal bleeding, contractions, leaking of fluid and fetal movement were reviewed in detail with the patient. Please refer to After Visit Summary for other counseling recommendations.   Return in  about 2 weeks (around 01/17/2024) for OB VISIT, MD or APP.  No future appointments.   Vina Solian, MD

## 2024-01-17 ENCOUNTER — Ambulatory Visit (INDEPENDENT_AMBULATORY_CARE_PROVIDER_SITE_OTHER): Admitting: Family Medicine

## 2024-01-17 ENCOUNTER — Other Ambulatory Visit: Payer: Self-pay

## 2024-01-17 VITALS — BP 102/69 | HR 96 | Wt 160.0 lb

## 2024-01-17 DIAGNOSIS — O0993 Supervision of high risk pregnancy, unspecified, third trimester: Secondary | ICD-10-CM | POA: Diagnosis not present

## 2024-01-17 DIAGNOSIS — O0990 Supervision of high risk pregnancy, unspecified, unspecified trimester: Secondary | ICD-10-CM

## 2024-01-17 DIAGNOSIS — O26893 Other specified pregnancy related conditions, third trimester: Secondary | ICD-10-CM

## 2024-01-17 DIAGNOSIS — Z6791 Unspecified blood type, Rh negative: Secondary | ICD-10-CM

## 2024-01-17 DIAGNOSIS — Z3A34 34 weeks gestation of pregnancy: Secondary | ICD-10-CM

## 2024-01-17 DIAGNOSIS — O26899 Other specified pregnancy related conditions, unspecified trimester: Secondary | ICD-10-CM

## 2024-01-17 NOTE — Progress Notes (Signed)
 PRENATAL VISIT NOTE  Subjective:  Pamela Santos is a 33 y.o. G2P1001 at [redacted]w[redacted]d being seen today for ongoing prenatal care.  She is currently monitored for the following issues for this low-risk pregnancy and has Rh negative state in antepartum period and Supervision of high risk pregnancy, antepartum on their problem list.  Patient reports no bleeding, no contractions, no cramping, and no leaking.  Contractions: Not present. Vag. Bleeding: None.  Movement: Present. Denies leaking of fluid.   The following portions of the patient's history were reviewed and updated as appropriate: allergies, current medications, past family history, past medical history, past social history, past surgical history and problem list.   Objective:   Vitals:   01/17/24 1605  BP: 102/69  Pulse: 96  Weight: 160 lb (72.6 kg)    Fetal Status:  Fetal Heart Rate (bpm): 142   Movement: Present    General: Alert, oriented and cooperative. Patient is in no acute distress.  Skin: Skin is warm and dry. No rash noted.   Cardiovascular: Normal heart rate noted  Respiratory: Normal respiratory effort, no problems with respiration noted  Abdomen: Soft, gravid, appropriate for gestational age.  Pain/Pressure: Absent     Pelvic: Cervical exam deferred        Extremities: Normal range of motion.  Edema: None  Mental Status: Normal mood and affect. Normal behavior. Normal judgment and thought content.      08/08/2023    3:38 PM 10/20/2022    1:58 PM 10/25/2021    4:18 PM  Depression screen PHQ 2/9  Decreased Interest 2 0 0  Down, Depressed, Hopeless 1 0 0  PHQ - 2 Score 3 0 0  Altered sleeping  0 0  Tired, decreased energy 2 0 0  Change in appetite 2 0 0  Feeling bad or failure about yourself  0 0 0  Trouble concentrating 1 0 0  Moving slowly or fidgety/restless 1 0 0  Suicidal thoughts 0 0 0  PHQ-9 Score  0  0      Data saved with a previous flowsheet row definition        08/08/2023    3:38 PM  10/20/2022    1:58 PM 10/25/2021    4:19 PM 05/28/2021    8:48 AM  GAD 7 : Generalized Anxiety Score  Nervous, Anxious, on Edge 0 0 0 1  Control/stop worrying 0 0 0 0  Worry too much - different things 1 0 0 0  Trouble relaxing 1 0 0 1  Restless 0 0 0 0  Easily annoyed or irritable 0 0 0 0  Afraid - awful might happen 0 0 0 0  Total GAD 7 Score 2 0 0 2    Assessment and Plan:  Pregnancy: G2P1001 at [redacted]w[redacted]d 1. Supervision of high risk pregnancy, antepartum (Primary) FHR BP appropriate today Patient reports swelling in her mons pubis but no abnormal swelling appreciated Offered French interpreter but patient declines  2. Rh negative state in antepartum period Previously received RhoGAM  3. [redacted] weeks gestation of pregnancy   Preterm labor symptoms and general obstetric precautions including but not limited to vaginal bleeding, contractions, leaking of fluid and fetal movement were reviewed in detail with the patient. Please refer to After Visit Summary for other counseling recommendations.   No follow-ups on file.  Future Appointments  Date Time Provider Department Center  01/30/2024  3:35 PM Milly Olam DELENA EDDY [facility]  02/07/2024  1:55 PM Neziah Vogelgesang,  Norleen GAILS, MD [facility]  02/14/2024  3:55 PM Anyanwu, Gloris LABOR, MD [facility]  02/21/2024  3:55 PM Ilean Norleen GAILS, MD [facility]  02/27/2024  3:55 PM Regino, Camie LABOR, CNM [facility]    Norleen GAILS Ilean, MD

## 2024-01-30 ENCOUNTER — Other Ambulatory Visit (HOSPITAL_COMMUNITY)
Admission: RE | Admit: 2024-01-30 | Discharge: 2024-01-30 | Disposition: A | Discharge status: Home / Self Care | Source: Ambulatory Visit | Attending: Advanced Practice Midwife | Admitting: Advanced Practice Midwife

## 2024-01-30 ENCOUNTER — Other Ambulatory Visit: Payer: Self-pay

## 2024-01-30 ENCOUNTER — Encounter: Payer: Self-pay | Admitting: Advanced Practice Midwife

## 2024-01-30 ENCOUNTER — Ambulatory Visit (INDEPENDENT_AMBULATORY_CARE_PROVIDER_SITE_OTHER): Admitting: Advanced Practice Midwife

## 2024-01-30 VITALS — BP 98/62 | HR 111 | Wt 163.3 lb

## 2024-01-30 DIAGNOSIS — O0993 Supervision of high risk pregnancy, unspecified, third trimester: Secondary | ICD-10-CM | POA: Insufficient documentation

## 2024-01-30 DIAGNOSIS — Z3A36 36 weeks gestation of pregnancy: Secondary | ICD-10-CM

## 2024-01-30 DIAGNOSIS — O0990 Supervision of high risk pregnancy, unspecified, unspecified trimester: Secondary | ICD-10-CM

## 2024-01-30 NOTE — Progress Notes (Signed)
 "  PRENATAL VISIT NOTE  Subjective:  Pamela Santos is a 33 y.o. G2P1001 at [redacted]w[redacted]d being seen today for ongoing prenatal care.  She is currently monitored for the following issues for this low-risk pregnancy and has Rh negative state in antepartum period and Supervision of high risk pregnancy, antepartum on their problem list.  Patient reports no complaints.  Contractions: Not present. Vag. Bleeding: None.  Movement: Present. Denies leaking of fluid.   The following portions of the patient's history were reviewed and updated as appropriate: allergies, current medications, past family history, past medical history, past social history, past surgical history and problem list.   Objective:   Vitals:   01/30/24 1520  BP: 98/62  Pulse: (!) 111  Weight: 163 lb 4.8 oz (74.1 kg)    Fetal Status:  Fetal Heart Rate (bpm): 158 Fundal Height: 36 cm Movement: Present    General: Alert, oriented and cooperative. Patient is in no acute distress.  Skin: Skin is warm and dry. No rash noted.   Cardiovascular: Normal heart rate noted  Respiratory: Normal respiratory effort, no problems with respiration noted  Abdomen: Soft, gravid, appropriate for gestational age.  Pain/Pressure: Present (pressure)     Pelvic: Cervical exam performed in the presence of a chaperone Dilation: Closed Effacement (%): 50 Station: -3  Extremities: Normal range of motion.  Edema: None  Mental Status: Normal mood and affect. Normal behavior. Normal judgment and thought content.      08/08/2023    3:38 PM 10/20/2022    1:58 PM 10/25/2021    4:18 PM  Depression screen PHQ 2/9  Decreased Interest 2 0 0  Down, Depressed, Hopeless 1 0 0  PHQ - 2 Score 3 0 0  Altered sleeping  0 0  Tired, decreased energy 2 0 0  Change in appetite 2 0 0  Feeling bad or failure about yourself  0 0 0  Trouble concentrating 1 0 0  Moving slowly or fidgety/restless 1 0 0  Suicidal thoughts 0 0 0  PHQ-9 Score  0  0      Data saved  with a previous flowsheet row definition        08/08/2023    3:38 PM 10/20/2022    1:58 PM 10/25/2021    4:19 PM 05/28/2021    8:48 AM  GAD 7 : Generalized Anxiety Score  Nervous, Anxious, on Edge 0 0 0 1  Control/stop worrying 0 0 0 0  Worry too much - different things 1 0 0 0  Trouble relaxing 1 0 0 1  Restless 0 0 0 0  Easily annoyed or irritable 0 0 0 0  Afraid - awful might happen 0 0 0 0  Total GAD 7 Score 2 0 0 2    Assessment and Plan:  Pregnancy: G2P1001 at [redacted]w[redacted]d 1. [redacted] weeks gestation of pregnancy (Primary)  - GC/Chlamydia probe amp ([place])not at [facility] - Culture, beta strep (group b only)  2. Supervision of high risk pregnancy, antepartum --Anticipatory guidance about next visits/weeks of pregnancy given.   - GC/Chlamydia probe amp ([place])not at [facility] - Culture, beta strep (group b only)  Preterm labor symptoms and general obstetric precautions including but not limited to vaginal bleeding, contractions, leaking of fluid and fetal movement were reviewed in detail with the patient. Please refer to After Visit Summary for other counseling recommendations.   Return in about 1 week (around 02/06/2024) for LOB.  Future Appointments  Date Time Provider  Department Center  02/07/2024  1:55 PM Ilean Norleen GAILS, MD [facility]  02/14/2024  3:55 PM Anyanwu, Gloris LABOR, MD [facility]  02/21/2024  3:55 PM Ilean Norleen GAILS, MD [facility]  02/27/2024  3:55 PM Regino, Camie LABOR, CNM [facility]    Olam Boards, CNM  "

## 2024-01-31 LAB — GC/CHLAMYDIA PROBE AMP (~~LOC~~) NOT AT ARMC
Chlamydia: NEGATIVE
Comment: NEGATIVE
Comment: NORMAL
Neisseria Gonorrhea: NEGATIVE

## 2024-02-01 NOTE — L&D Delivery Note (Cosign Needed Addendum)
 Delivery Note Pamela Santos is a 34 y.o. G2P1001 at [redacted]w[redacted]d admitted for spontaneous labor.   GBS Status:  Negative/-- (12/30 1722)  Labor course: Initial SVE: 9cm. Augmentation with: N/A. She then progressed to complete.  ROM: 4h 36m with yellow fluid  Birth: After a short 2nd stage, she delivered a Live born female  Birth Weight:   APGAR: , 9  Newborn Delivery   Birth date/time: [birth year]2024 07:20:00 Delivery type: Vaginal, Spontaneous        Delivered via spontaneous vaginal delivery (Presentation: vertex ). Nuchal cord present: No.. Shoulders and body delivered in usual fashion. Infant placed directly on mom's abdomen for bonding/skin-to-skin, baby dried and stimulated. Cord clamped x 2 after 1 minute and cut by FOB. Placenta delivered-Spontaneous with 3 vessels. 20u Pitocin  in 500cc LR given as a bolus after delivery of placenta.  Fundus firm with massage. Placenta inspected and appears to be intact with a 3 VC.  Sponge and instrument count were correct x2.  Intrapartum complications:  None Anesthesia:  none Lacerations:  small abrasion near perineal area, hemostatic with pressure Suture Repair: N/A EBL (mL): 20mL  Newborn Data: Birth date:[birth year]20[birth year] Birth time:7:20 AM Gender:Female Living status:Living Apgars: ,9  Weight:     Mom to postpartum.  Baby to Couplet care / Skin to Skin. Placenta to L&D   Plans to Breastfeed Contraception: none Circumcision: wants outpatient  Note sent to [facility]: [facility] for pp visit.  Delivery Report:   Review the Delivery Report for details.     Signed: Garen SHAUNNA Puffer, MD, PGY-1 02/19/2024, 7:44 AM   Attestation of Supervision of Student:  I confirm that I have verified the information documented in the resident's note and that I have also personally directly supervised the history, physical exam and all medical decision making activities.  I have verified that all services and findings are accurately documented in this student's  note; and I agree with management and plan as outlined in the documentation. I have also made any necessary editorial changes.   Barkley LITTIE Angles, MD OB Fellow 02/19/2024 11:31 PM

## 2024-02-03 LAB — CULTURE, BETA STREP (GROUP B ONLY): Strep Gp B Culture: NEGATIVE

## 2024-02-07 ENCOUNTER — Other Ambulatory Visit: Payer: Self-pay

## 2024-02-07 ENCOUNTER — Ambulatory Visit: Admitting: Family Medicine

## 2024-02-07 VITALS — BP 105/70 | HR 87 | Wt 162.4 lb

## 2024-02-07 DIAGNOSIS — O0993 Supervision of high risk pregnancy, unspecified, third trimester: Secondary | ICD-10-CM

## 2024-02-07 DIAGNOSIS — O26893 Other specified pregnancy related conditions, third trimester: Secondary | ICD-10-CM

## 2024-02-07 DIAGNOSIS — Z6791 Unspecified blood type, Rh negative: Secondary | ICD-10-CM

## 2024-02-07 DIAGNOSIS — Z3A37 37 weeks gestation of pregnancy: Secondary | ICD-10-CM | POA: Diagnosis not present

## 2024-02-07 DIAGNOSIS — O0990 Supervision of high risk pregnancy, unspecified, unspecified trimester: Secondary | ICD-10-CM

## 2024-02-07 NOTE — Progress Notes (Signed)
 "  PRENATAL VISIT NOTE  Subjective:  Pamela Santos is a 34 y.o. G2P1001 at [redacted]w[redacted]d being seen today for ongoing prenatal care.  She is currently monitored for the following issues for this low-risk pregnancy and has Rh negative state in antepartum period and Supervision of high risk pregnancy, antepartum on their problem list.  Patient reports no bleeding, no cramping, no leaking, and occasional contractions.  Contractions: Not present. Vag. Bleeding: None.  Movement: Present. Denies leaking of fluid.   The following portions of the patient's history were reviewed and updated as appropriate: allergies, current medications, past family history, past medical history, past social history, past surgical history and problem list.   Objective:   Vitals:   02/07/24 1358  BP: 105/70  Pulse: 87  Weight: 162 lb 6.4 oz (73.7 kg)    Fetal Status:  Fetal Heart Rate (bpm): 137   Movement: Present    General: Alert, oriented and cooperative. Patient is in no acute distress.  Skin: Skin is warm and dry. No rash noted.   Cardiovascular: Normal heart rate noted  Respiratory: Normal respiratory effort, no problems with respiration noted  Abdomen: Soft, gravid, appropriate for gestational age.  Pain/Pressure: Present     Pelvic: Cervical exam deferred        Extremities: Normal range of motion.  Edema: None  Mental Status: Normal mood and affect. Normal behavior. Normal judgment and thought content.      08/08/2023    3:38 PM 10/20/2022    1:58 PM 10/25/2021    4:18 PM  Depression screen PHQ 2/9  Decreased Interest 2 0 0  Down, Depressed, Hopeless 1 0 0  PHQ - 2 Score 3 0 0  Altered sleeping  0 0  Tired, decreased energy 2 0 0  Change in appetite 2 0 0  Feeling bad or failure about yourself  0 0 0  Trouble concentrating 1 0 0  Moving slowly or fidgety/restless 1 0 0  Suicidal thoughts 0 0 0  PHQ-9 Score  0  0      Data saved with a previous flowsheet row definition         08/08/2023    3:38 PM 10/20/2022    1:58 PM 10/25/2021    4:19 PM 05/28/2021    8:48 AM  GAD 7 : Generalized Anxiety Score  Nervous, Anxious, on Edge 0 0 0 1  Control/stop worrying 0 0 0 0  Worry too much - different things 1 0 0 0  Trouble relaxing 1 0 0 1  Restless 0 0 0 0  Easily annoyed or irritable 0 0 0 0  Afraid - awful might happen 0 0 0 0  Total GAD 7 Score 2 0 0 2    Assessment and Plan:  Pregnancy: G2P1001 at [redacted]w[redacted]d 1. Supervision of high risk pregnancy, antepartum (Primary) FHR and BP appropriate today  2. Rh negative state in antepartum period Will need RhoGAM eval postpartum  3. [redacted] weeks gestation of pregnancy Discussed kick counts and MAU precautions  Term labor symptoms and general obstetric precautions including but not limited to vaginal bleeding, contractions, leaking of fluid and fetal movement were reviewed in detail with the patient. Please refer to After Visit Summary for other counseling recommendations.   No follow-ups on file.  Future Appointments  Date Time Provider Department Center  02/14/2024  3:55 PM Anyanwu, Gloris LABOR, MD [facility]  02/21/2024  3:55 PM Ilean Norleen GAILS, MD [facility]  02/27/2024  3:55 PM Warren-Hill, Camie LABOR, CNM [facility]    Norleen LULLA Rover, MD  "

## 2024-02-14 ENCOUNTER — Encounter: Payer: Self-pay | Admitting: Obstetrics & Gynecology

## 2024-02-14 ENCOUNTER — Ambulatory Visit: Admitting: Obstetrics & Gynecology

## 2024-02-14 ENCOUNTER — Other Ambulatory Visit: Payer: Self-pay

## 2024-02-14 VITALS — BP 104/68 | HR 92 | Wt 162.1 lb

## 2024-02-14 DIAGNOSIS — Z3A38 38 weeks gestation of pregnancy: Secondary | ICD-10-CM

## 2024-02-14 DIAGNOSIS — Z3483 Encounter for supervision of other normal pregnancy, third trimester: Secondary | ICD-10-CM | POA: Diagnosis not present

## 2024-02-14 NOTE — Patient Instructions (Addendum)
 Return to office for any scheduled appointments. Call the office or go to the MAU at [facility] if: You begin to have strong, frequent contractions Your water breaks.  Sometimes it is a big gush of fluid, sometimes it is just a trickle that keeps getting your underwear wet or running down your legs You have vaginal bleeding.  It is normal to have a small amount of spotting if your cervix was checked.  You do not feel your baby moving like normal.  If you do not, get something to eat and drink and lay down and focus on feeling your baby move.   If your baby is still not moving like normal, you should call the office or go to MAU. Any other obstetric concerns.     Things to Try for Cervical Ripening (to get your cervix ready for labor) : May try one or all: Walking Nipple Stimulation (manually or with breast pump) Intercourse Try the [employer] at [email or web address].[email or web address] daily to improve baby's position and encourage the onset of labor. Cervical Ripening: May try one or both Red Raspberry Leaf capsules or tea:  two 300mg  or 400mg  tablets with each meal, 2-3 times a day, or 1-3 cups of tea daily  Potential Side Effects Of Raspberry Leaf:  Most women do not experience any side effects from drinking raspberry leaf tea. However, nausea and loose stools are possible  Evening Primrose Oil capsules: take 1 capsule by mouth and place one capsule in the vagina every night.    Some of the potential side effects:  Upset stomach  Loose stools or diarrhea  Headaches  Nausea Dates (six a day, may taste better if warmed in microwave until soft). Found where raisins are in the grocery store

## 2024-02-14 NOTE — Progress Notes (Signed)
" ° °  PRENATAL VISIT NOTE  Subjective:  Pamela Santos is a 34 y.o. G2P1001 at [redacted]w[redacted]d being seen today for ongoing prenatal care.  She is currently monitored for the following issues for this low-risk pregnancy and has Rh negative state in antepartum period and Supervision of normal pregnancy on their problem list.  Patient reports occasional contractions.  Contractions: Irritability. Vag. Bleeding: None.  Movement: Present. Denies leaking of fluid.   The following portions of the patient's history were reviewed and updated as appropriate: allergies, current medications, past family history, past medical history, past social history, past surgical history and problem list.   Objective:   Vitals:   02/14/24 1603  BP: 104/68  Pulse: 92  Weight: 162 lb 1.6 oz (73.5 kg)    Fetal Status:  Fetal Heart Rate (bpm): 140 Fundal Height: 38 cm Movement: Present Presentation: Vertex  General: Alert, oriented and cooperative. Patient is in no acute distress.  Skin: Skin is warm and dry. No rash noted.   Cardiovascular: Normal heart rate noted  Respiratory: Normal respiratory effort, no problems with respiration noted  Abdomen: Soft, gravid, appropriate for gestational age.  Pain/Pressure: Present     Pelvic: Cervical exam performed in the presence of a chaperone Theatre Manager, CMA) Dilation: 3.5 Effacement (%): 70 Station: -3  Extremities: Normal range of motion.  Edema: None  Mental Status: Normal mood and affect. Normal behavior. Normal judgment and thought content.      08/08/2023    3:38 PM 10/20/2022    1:58 PM 10/25/2021    4:18 PM  Depression screen PHQ 2/9  Decreased Interest 2 0 0  Down, Depressed, Hopeless 1 0 0  PHQ - 2 Score 3 0 0  Altered sleeping  0 0  Tired, decreased energy 2 0 0  Change in appetite 2 0 0  Feeling bad or failure about yourself  0 0 0  Trouble concentrating 1 0 0  Moving slowly or fidgety/restless 1 0 0  Suicidal thoughts 0 0 0  PHQ-9 Score  0  0       Data saved with a previous flowsheet row definition        08/08/2023    3:38 PM 10/20/2022    1:58 PM 10/25/2021    4:19 PM 05/28/2021    8:48 AM  GAD 7 : Generalized Anxiety Score  Nervous, Anxious, on Edge 0 0 0 1  Control/stop worrying 0 0 0 0  Worry too much - different things 1 0 0 0  Trouble relaxing 1 0 0 1  Restless 0 0 0 0  Easily annoyed or irritable 0 0 0 0  Afraid - awful might happen 0 0 0 0  Total GAD 7 Score 2 0 0 2    Assessment and Plan:  Pregnancy: G2P1001 at [redacted]w[redacted]d 1. [redacted] weeks gestation of pregnancy (Primary) 2. Encounter for supervision of other normal pregnancy in third trimester Favorable cervix, patient was happy! Labor symptoms and general obstetric precautions including but not limited to vaginal bleeding, contractions, leaking of fluid and fetal movement were reviewed in detail with the patient. Please refer to After Visit Summary for other counseling recommendations.   Return in about 1 week (around 02/21/2024) for OFFICE OB VISIT (MD or APP).  Future Appointments  Date Time Provider Department Center  02/21/2024  3:55 PM Ilean Norleen GAILS, MD [facility]  02/27/2024  3:55 PM Regino, Camie LABOR, CNM [facility]    Gloris Hugger, MD  "

## 2024-02-19 ENCOUNTER — Encounter (HOSPITAL_COMMUNITY): Payer: Self-pay | Admitting: Obstetrics and Gynecology

## 2024-02-19 ENCOUNTER — Other Ambulatory Visit: Payer: Self-pay

## 2024-02-19 ENCOUNTER — Inpatient Hospital Stay (HOSPITAL_COMMUNITY)
Admission: AD | Admit: 2024-02-19 | Discharge: 2024-02-21 | DRG: 807 | Disposition: A | Discharge status: Home / Self Care | Attending: Family Medicine | Admitting: Family Medicine

## 2024-02-19 DIAGNOSIS — Z6791 Unspecified blood type, Rh negative: Secondary | ICD-10-CM | POA: Diagnosis not present

## 2024-02-19 DIAGNOSIS — Z3A39 39 weeks gestation of pregnancy: Secondary | ICD-10-CM

## 2024-02-19 DIAGNOSIS — O26893 Other specified pregnancy related conditions, third trimester: Principal | ICD-10-CM | POA: Diagnosis present

## 2024-02-19 LAB — COMPREHENSIVE METABOLIC PANEL WITH GFR
ALT: 15 U/L (ref 0–44)
AST: 27 U/L (ref 15–41)
Albumin: 3.7 g/dL (ref 3.5–5.0)
Alkaline Phosphatase: 225 U/L — ABNORMAL HIGH (ref 38–126)
Anion gap: 13 (ref 5–15)
BUN: <5 mg/dL — ABNORMAL LOW (ref 6–20)
CO2: 18 mmol/L — ABNORMAL LOW (ref 22–32)
Calcium: 9.3 mg/dL (ref 8.9–10.3)
Chloride: 103 mmol/L (ref 98–111)
Creatinine, Ser: 0.58 mg/dL (ref 0.44–1.00)
GFR, Estimated: >60 mL/min
Glucose, Bld: 80 mg/dL (ref 70–99)
Potassium: 3.7 mmol/L (ref 3.5–5.1)
Sodium: 133 mmol/L — ABNORMAL LOW (ref 135–145)
Total Bilirubin: 0.3 mg/dL (ref 0.0–1.2)
Total Protein: 7.1 g/dL (ref 6.5–8.1)

## 2024-02-19 LAB — CBC
HCT: 39 % (ref 36.0–46.0)
Hemoglobin: 13.2 g/dL (ref 12.0–15.0)
MCH: 29 pg (ref 26.0–34.0)
MCHC: 33.8 g/dL (ref 30.0–36.0)
MCV: 85.7 fL (ref 80.0–100.0)
Platelets: 205 K/uL (ref 150–400)
RBC: 4.55 MIL/uL (ref 3.87–5.11)
RDW: 14.5 % (ref 11.5–15.5)
WBC: 6.6 K/uL (ref 4.0–10.5)
nRBC: 0 % (ref 0.0–0.2)

## 2024-02-19 LAB — TYPE AND SCREEN
ABO/RH(D): O NEG
Antibody Screen: POSITIVE

## 2024-02-19 LAB — SYPHILIS: RPR W/REFLEX TO RPR TITER AND TREPONEMAL ANTIBODIES, TRADITIONAL SCREENING AND DIAGNOSIS ALGORITHM: RPR Ser Ql: NONREACTIVE

## 2024-02-19 LAB — POCT FERN TEST: POCT Fern Test: POSITIVE

## 2024-02-19 MED ORDER — SIMETHICONE 80 MG PO CHEW: 80.0000 mg | CHEWABLE_TABLET | ORAL | Status: DC | PRN

## 2024-02-19 MED ORDER — ACETAMINOPHEN 325 MG PO TABS: 650.0000 mg | ORAL_TABLET | ORAL | Status: DC | PRN

## 2024-02-19 MED ORDER — PRENATAL MULTIVITAMIN CH
1.0000 | ORAL_TABLET | Freq: Every day | ORAL | Status: DC
  Administered 2024-02-19 – 2024-02-21 (×3): 1 via ORAL
  Filled 2024-02-19 (×3): qty 1

## 2024-02-19 MED ORDER — ONDANSETRON HCL 4 MG PO TABS: 4.0000 mg | ORAL_TABLET | ORAL | Status: DC | PRN

## 2024-02-19 MED ORDER — SODIUM CHLORIDE 0.9% FLUSH: 3.0000 mL | INTRAVENOUS | Status: DC | PRN

## 2024-02-19 MED ORDER — LACTATED RINGERS IV SOLN: 500.0000 mL | INTRAVENOUS | Status: DC | PRN

## 2024-02-19 MED ORDER — OXYCODONE-ACETAMINOPHEN 5-325 MG PO TABS: 1.0000 | ORAL_TABLET | ORAL | Status: DC | PRN

## 2024-02-19 MED ORDER — MEASLES, MUMPS & RUBELLA VAC ~~LOC~~ SUSR: 0.5000 mL | Freq: Once | SUBCUTANEOUS | Status: DC

## 2024-02-19 MED ORDER — COCONUT OIL OIL
1.0000 | TOPICAL_OIL | Status: DC | PRN
  Administered 2024-02-19: 1 via TOPICAL

## 2024-02-19 MED ORDER — OXYCODONE-ACETAMINOPHEN 5-325 MG PO TABS: 2.0000 | ORAL_TABLET | ORAL | Status: DC | PRN

## 2024-02-19 MED ORDER — SODIUM CHLORIDE 0.9 % IV SOLN: 250.0000 mL | INTRAVENOUS | Status: DC | PRN

## 2024-02-19 MED ORDER — SOD CITRATE-CITRIC ACID 500-334 MG/5ML PO SOLN: 30.0000 mL | ORAL | Status: DC | PRN

## 2024-02-19 MED ORDER — OXYCODONE HCL 5 MG PO TABS: 5.0000 mg | ORAL_TABLET | Freq: Four times a day (QID) | ORAL | Status: DC | PRN

## 2024-02-19 MED ORDER — LIDOCAINE HCL (PF) 1 % IJ SOLN: 30.0000 mL | INTRAMUSCULAR | Status: DC | PRN

## 2024-02-19 MED ORDER — LACTATED RINGERS IV SOLN: INTRAVENOUS | Status: DC

## 2024-02-19 MED ORDER — TETANUS-DIPHTH-ACELL PERTUSSIS 5-2-15.5 LF-MCG/0.5 IM SUSP: 0.5000 mL | Freq: Once | INTRAMUSCULAR | Status: DC

## 2024-02-19 MED ORDER — SODIUM CHLORIDE 0.9% FLUSH
3.0000 mL | Freq: Two times a day (BID) | INTRAVENOUS | Status: DC
  Administered 2024-02-19 – 2024-02-20 (×2): 3 mL via INTRAVENOUS

## 2024-02-19 MED ORDER — IBUPROFEN 800 MG PO TABS
800.0000 mg | ORAL_TABLET | Freq: Three times a day (TID) | ORAL | Status: DC
  Administered 2024-02-19 – 2024-02-21 (×7): 800 mg via ORAL
  Filled 2024-02-19 (×7): qty 1

## 2024-02-19 MED ORDER — OXYTOCIN-SODIUM CHLORIDE 30-0.9 UT/500ML-% IV SOLN
2.5000 [IU]/h | INTRAVENOUS | Status: DC
  Filled 2024-02-19: qty 500

## 2024-02-19 MED ORDER — BENZOCAINE-MENTHOL 20-0.5 % EX AERO
1.0000 | INHALATION_SPRAY | CUTANEOUS | Status: DC | PRN
  Administered 2024-02-19: 1 via TOPICAL
  Filled 2024-02-19: qty 56

## 2024-02-19 MED ORDER — SENNOSIDES-DOCUSATE SODIUM 8.6-50 MG PO TABS
2.0000 | ORAL_TABLET | ORAL | Status: DC
  Administered 2024-02-19 – 2024-02-21 (×3): 2 via ORAL
  Filled 2024-02-19 (×3): qty 2

## 2024-02-19 MED ORDER — DIPHENHYDRAMINE HCL 25 MG PO CAPS: 25.0000 mg | ORAL_CAPSULE | Freq: Four times a day (QID) | ORAL | Status: DC | PRN

## 2024-02-19 MED ORDER — WITCH HAZEL-GLYCERIN EX PADS: 1.0000 | MEDICATED_PAD | CUTANEOUS | Status: DC | PRN

## 2024-02-19 MED ORDER — ONDANSETRON HCL 4 MG/2ML IJ SOLN
4.0000 mg | Freq: Four times a day (QID) | INTRAMUSCULAR | Status: DC | PRN
  Administered 2024-02-19: 4 mg via INTRAVENOUS
  Filled 2024-02-19: qty 2

## 2024-02-19 MED ORDER — OXYTOCIN BOLUS FROM INFUSION
333.0000 mL | Freq: Once | INTRAVENOUS | Status: AC
  Administered 2024-02-19: 333 mL via INTRAVENOUS

## 2024-02-19 MED ORDER — ONDANSETRON HCL 4 MG/2ML IJ SOLN: 4.0000 mg | INTRAMUSCULAR | Status: DC | PRN

## 2024-02-19 MED ORDER — DIBUCAINE (PERIANAL) 1 % EX OINT: 1.0000 | TOPICAL_OINTMENT | CUTANEOUS | Status: DC | PRN

## 2024-02-19 MED ORDER — FENTANYL CITRATE (PF) 100 MCG/2ML IJ SOLN: 50.0000 ug | INTRAMUSCULAR | Status: DC | PRN

## 2024-02-19 NOTE — Discharge Summary (Signed)
 "    Postpartum Discharge Summary  Date of Service updated***     Patient Name: Pamela Santos DOB: 1990-06-21 MRN: [id number]  Date of admission: 02/19/2024 Delivery date:02/19/2024 Delivering provider: MAGALI BARKLEY CROME Date of discharge: 02/20/2024  Admitting diagnosis: Indication for care or intervention related to labor and delivery [O75.9] Intrauterine pregnancy: [redacted]w[redacted]d     Secondary diagnosis:  Principal Problem:   Indication for care or intervention related to labor and delivery  Additional problems: Rh-    Discharge diagnosis: Term Pregnancy Delivered                                              Post partum procedures:rhogam Augmentation: N/A Complications: None  Hospital course: Onset of Labor With Vaginal Delivery      34 y.o. yo [id number] at [redacted]w[redacted]d was admitted in Latent Labor on 02/19/2024. Labor course was uncomplicated.  Membrane Rupture Time/Date: 3:00 AM,02/19/2024  Delivery Method:Vaginal, Spontaneous Operative Delivery:N/A Episiotomy: None Lacerations:  None Patient had a postpartum course complicated by nipple pain.  She is ambulating, tolerating a regular diet, passing flatus, and urinating well. Patient is discharged home in stable condition on 02/20/24.  Newborn Data: Birth date:02/19/2024 Birth time:7:20 AM Gender:Female Living status:Living Apgars:9 ,9  Weight:3620 g  Magnesium Sulfate received: No BMZ received: No Rhophylac :Yes MMR:N/A T-DaP:Given prenatally Flu: No RSV Vaccine received: Yes Transfusion:{Transfusion received:[id number]}  Immunizations received: Immunization History  Administered Date(s) Administered    sv, Bivalent, Protein Subunit Rsvpref,pf Marlow) 01/03/2024   Tdap 03/25/2021, 12/13/2023    Physical exam  Vitals:   02/19/24 1409 02/19/24 1825 02/19/24 2338 02/20/24 0609  BP: 113/82 100/67 102/69 102/71  Pulse: 69 81 72 69  Resp: 18 18 18 18   Temp: 98.4 F (36.9 C) 98.2 F (36.8 C) 98.3 F (36.8 C) 98.3 F  (36.8 C)  TempSrc: Oral Oral Oral Oral  SpO2: 99% 100% 99% 99%  Weight:      Height:       General: alert, cooperative, and no distress Lochia: appropriate Uterine Fundus: firm Incision: N/A DVT Evaluation: No evidence of DVT seen on physical exam. Labs: Lab Results  Component Value Date   WBC 6.6 02/19/2024   HGB 13.2 02/19/2024   HCT 39.0 02/19/2024   MCV 85.7 02/19/2024   PLT 205 02/19/2024      Latest Ref Rng & Units 02/19/2024    5:46 AM  CMP  Glucose 70 - 99 mg/dL 80   BUN 6 - 20 mg/dL <5   Creatinine 9.55 - 1.00 mg/dL 9.41   Sodium 864 - 854 mmol/L 133   Potassium 3.5 - 5.1 mmol/L 3.7   Chloride 98 - 111 mmol/L 103   CO2 22 - 32 mmol/L 18   Calcium  8.9 - 10.3 mg/dL 9.3   Total Protein 6.5 - 8.1 g/dL 7.1   Total Bilirubin 0.0 - 1.2 mg/dL 0.3   Alkaline Phos 38 - 126 U/L 225   AST 15 - 41 U/L 27   ALT 0 - 44 U/L 15    Edinburgh Score:    02/19/2024    9:44 AM  Edinburgh Postnatal Depression Scale Screening Tool  I have been able to laugh and see the funny side of things. 0  I have looked forward with enjoyment to things. 0  I have blamed myself unnecessarily when things went wrong. 0  I have been anxious or worried for no good reason. 0  I have felt scared or panicky for no good reason. 0  Things have been getting on top of me. 0  I have been so unhappy that I have had difficulty sleeping. 0  I have felt sad or miserable. 0  I have been so unhappy that I have been crying. 1  The thought of harming myself has occurred to me. 0  Edinburgh Postnatal Depression Scale Total 1   Edinburgh Postnatal Depression Scale Total: 1   After visit meds:  Allergies as of 02/20/2024   No Known Allergies   Med Rec must be completed prior to using this [facility]***        Discharge home in stable condition Infant Feeding: Breast Infant Disposition:home with mother Discharge instruction: per After Visit Summary and Postpartum booklet. Activity: Advance as  tolerated. Pelvic rest for 6 weeks.  Diet: routine diet Future Appointments: Future Appointments  Date Time Provider Department Center  04/01/2024  8:35 AM Ilean, Norleen GAILS, MD [facility]   Follow up Visit:   Please schedule this patient for a In person postpartum visit in 6 weeks with the following provider: Any provider. Additional Postpartum F/U:N/A  Low risk pregnancy complicated by: N/A Delivery mode:  Vaginal, Spontaneous Anticipated Birth Control:  Unsure  Message sent to [facility] 02/19/23  02/20/2024 Garen SHAUNNA Puffer, MD    "

## 2024-02-19 NOTE — Lactation Note (Signed)
 This note was copied from a baby's chart. Lactation Consultation Note  Patient Name: Pamela Santos Date: 02/19/2024 Age:32 hours Reason for consult: Initial assessment;Term;Nipple pain/trauma;Breastfeeding assistance (infant DAT+) Video interpreter Dorma [phone]) used to communicate with MOB per FOB request. Despite using the interpreter, MOB responded to [facility] in English only.  P2- MOB reports that infant has been latching consistently, but it hurts really bad. Infant had just fed, but MOB was receptive to relatching. MOB placed infant on the left breast in the cradle hold. Infant was belly up and turning his head. When repositioning infant belly to belly, he quickly sucked in MOB's nipple. MOB's body reaction showed that she was in a lot of pain. LC unlatched infant quickly. LC reviewed the cross cradle hold and stroking her nipple from infant's nose to chin to elicit a gaping mouth. Infant latched deep with flanged lips and a strong rhythmic pull. MOB reported that this felt much better and she was no longer in pain. Infant was still nursing when [facility] left room.  LC reviewed the first 24 hr birthday nap, day 2 cluster feeding, feeding infant on cue 8-12x in 24 hrs, not allowing infant to go over 3 hrs without a feeding, CDC milk storage guidelines, LC services handout and engorgement/breast care. LC encouraged MOB to call for further assistance as needed.  Maternal Data Has patient been taught Hand Expression?: No Does the patient have breastfeeding experience prior to this delivery?: Yes How long did the patient breastfeed?: 1 year  Feeding Mother's Current Feeding Choice: Breast Milk  LATCH Score Latch: Grasps breast easily, tongue down, lips flanged, rhythmical sucking.  Audible Swallowing: Spontaneous and intermittent  Type of Nipple: Everted at rest and after stimulation  Comfort (Breast/Nipple): Soft / non-tender  Hold (Positioning): Assistance needed to correctly  position infant at breast and maintain latch.  LATCH Score: 9   Lactation Tools Discussed/Used Pump Education: Milk Storage  Interventions Interventions: Breast feeding basics reviewed;Assisted with latch;Breast compression;Adjust position;Support pillows;Position options;Education;LC Services brochure  Discharge Discharge Education: Engorgement and breast care;Warning signs for feeding baby Pump: Hands Free;Personal  Consult Status Consult Status: Follow-up Date: 02/20/24 Follow-up type: In-patient    Recardo Hoit BS, IBCLC 02/19/2024, 6:20 PM

## 2024-02-19 NOTE — MAU Note (Signed)
 Patient is G2P1, [redacted]w[redacted]d that presents with complaint of contractions since 0300. Patient rates her pain a 7 on a 0-10 pain scale. Patient states that she has noticed some leaking fluid and a little bit of blood in the fluid. Patient has +FM. VSS. BP 107/76. Monitors applied and assessing.

## 2024-02-19 NOTE — H&P (Cosign Needed Addendum)
 OBSTETRIC ADMISSION HISTORY AND PHYSICAL  Pamela Santos is a 34 y.o. female G2P1001 with IUP at [redacted]w[redacted]d by US  presenting for contractions. She reports +FMs, No LOF, no VB, no blurry vision, headaches or peripheral edema, and RUQ pain.  She plans on breast feeding. She request nothing for birth control. She received her prenatal [facility] at [facility]   Dating: By US  --->  Estimated Date of Delivery: 02/26/24  Sono:    @[redacted]w[redacted]d , CWD, normal anatomy, cephalic presentation, anterior fundal, 2112g, 74% EFW   Prenatal History/Complications:  NURSING  PROVIDER  Conservator, [employer] Dating by lmp  [facility] Model Traditional Anatomy U/S    Initiated care at  [facility]  [place]               LAB RESULTS   Support Person   Genetics NIPS: LR AFP: Neg      NT/IT (FT only)        Carrier Screen Horizon: Neg x4  Rhogam  O/Negative/-- (07/08 1530) 12/06/23 A1C/GTT Early HgbA1C:  Third trimester 2 hr GTT: Normal  Flu Vaccine Declined 12/13/23      TDaP Vaccine Given 12/13/23 Blood Type O/Negative/-- (07/08 1530)  RSV Vaccine Given 01/03/24 Antibody Negative (07/08 1530)  COVID Vaccine 2- doses Rubella 7.54 (07/08 1530)  Feeding Plan breast RPR Non Reactive (07/08 1530)  Contraception None HBsAg Negative (07/08 1530)  Circumcision Yes HIV Non Reactive (07/08 1530)  Pediatrician  [facility] HCVAb Non Reactive (07/08 1530)  Prenatal Classes        BTL Consent   Pap       Diagnosis  Date Value Ref Range Status  08/10/2021     Final    - Negative for intraepithelial lesion or malignancy (NILM)    BTL Pre-payment   GC/CT Initial:   36wks:    VBAC Consent   GBS For PCN allergy, check sensitivities   BRx Optimized? [ ]  yes   [ ]  no      DME Rx [ x] BP cuff [ ]  Weight Scale Waterbirth  [ ]  Class [ ]  Consent [ ]  CNM visit  PHQ9 & GAD7 [  ] new OB [  ] 28 weeks  [  ] 36 weeks Induction  [ ]  Orders Entered [ ] Foley Y/N     Past Medical History: Past  Medical History:  Diagnosis Date   Rh negative state in antepartum period 03/06/2021   Tibia/fibula fracture, shaft, left, closed, initial encounter 12/15/2020   Added automatically from request for surgery [id number]    Past Surgical History: Past Surgical History:  Procedure Laterality Date   TIBIA IM NAIL INSERTION Left 12/16/2020   Procedure: INTRAMEDULLARY (IM) NAIL TIBIAL;  Surgeon: Kendal Franky SQUIBB, MD;  Location: [facility] OR;  Service: Orthopedics;  Laterality: Left;    Obstetrical History: OB History     Gravida  2   Para  1   Term  1   Preterm      AB      Living  1      SAB      IAB      Ectopic      Multiple  1   Live Births  1           Social History Social History   Socioeconomic History   Marital status: Married    Spouse  name: Not on file   Number of children: Not on file   Years of education: Not on file   Highest education level: Not on file  Occupational History   Not on file  Tobacco Use   Smoking status: Never   Smokeless tobacco: Never  Vaping Use   Vaping status: Never Used  Substance and Sexual Activity   Alcohol use: Not Currently   Drug use: Never   Sexual activity: Yes    Birth control/protection: None  Other Topics Concern   Not on file  Social History Narrative   Not on file   Social Drivers of Health   Tobacco Use: Low Risk (02/19/2024)   Patient History    Smoking Tobacco Use: Never    Smokeless Tobacco Use: Never    Passive Exposure: Not on file  Financial Resource Strain: Not on file  Food Insecurity: Food Insecurity Present (08/08/2023)   Epic    Worried About Programme Researcher, Broadcasting/film/video in the Last Year: Sometimes true    [employer] of Food in the Last Year: Never true  Transportation Needs: No Transportation Needs (08/08/2023)   Epic    Lack of Transportation (Medical): No    Lack of Transportation (Non-Medical): No  Physical Activity: Not on file  Stress: Not on file  Social Connections: Not on file  Depression  (PHQ2-9): Medium Risk (08/08/2023)   Depression (PHQ2-9)    PHQ-2 Score: 9  Alcohol Screen: Not on file  Housing: Low Risk (07/28/2023)   Epic    Unable to Pay for Housing in the Last Year: No    Number of Times Moved in the Last Year: 0    Homeless in the Last Year: No  Utilities: Not At Risk (07/28/2023)   Epic    Threatened with loss of utilities: No  Health Literacy: Not on file    Family History: Family History  Problem Relation Age of Onset   Diabetes Neg Hx    Hypertension Neg Hx    Cancer Neg Hx     Allergies: Allergies[1]  Medications Prior to Admission  Medication Sig Dispense Refill Last Dose/Taking   ondansetron  (ZOFRAN -ODT) 4 MG disintegrating tablet Take 1 tablet (4 mg total) by mouth every 6 (six) hours as needed for nausea. 90 tablet 3 Past Week Morning   prenatal vitamin w/FE, FA (PRENATAL 1 + 1) 27-1 MG TABS tablet Take 1 tablet by mouth daily at 12 noon. 30 tablet 11 02/18/2024 Noon   Lidocaine -Hydrocortisone  Ace 3-0.5 % CREA Apply 1 Application topically 2 (two) times daily. (Patient not taking: Reported on 02/14/2024) 85 g 0    polyethylene glycol powder (GLYCOLAX /MIRALAX ) 17 GM/SCOOP powder Take 17 g by mouth daily as needed for moderate constipation. 500 g 2      Review of Systems   All systems reviewed and negative except as stated in HPI  Blood pressure 108/73, pulse (!) 104, temperature (!) 97.4 F (36.3 C), temperature source Axillary, resp. rate 20, height 5' 3 (1.6 m), weight 73.5 kg, last menstrual period 05/23/2023, SpO2 100%, unknown if currently breastfeeding. General appearance: alert, cooperative, and moderate distress Lungs: normal respiratory effort Heart: regular rate  Abdomen: soft, non-tender; bowel sounds normal Extremities: Homans sign is negative, no sign of DVT Presentation: cephalic Fetal monitoringBaseline: 140 bpm, moderate variability, accels present Uterine activity 1-2 minutes Dilation: 9 Effacement (%): 90 Station:  0 Exam by:: Bria Torrence   Prenatal labs: ABO, Rh: --/--/O NEG (01/19 0535) Antibody: POS (01/19 0535)  Rubella: 7.54 (07/08 1530) RPR: Non Reactive (10/27 0959)  HBsAg: Negative (07/08 1530)  HIV: Non Reactive (10/27 0959)  GBS: Negative/-- (12/30 1722)    Lab Results  Component Value Date   GBS Negative 01/30/2024   GTT normal Genetic screening  normal Anatomy US  [redacted]w[redacted]d anatomic structures that were well seen appear normal  without evidence of soft markers. Due to poor acoustic windows some structures remain suboptimally visualized.  Immunization History  Administered Date(s) Administered    sv, Bivalent, Protein Subunit Rsvpref,pf Marlow) 01/03/2024   Tdap 03/25/2021, 12/13/2023    Prenatal Transfer Tool  Maternal Diabetes: No Genetic Screening: Normal Maternal Ultrasounds/Referrals: Normal Fetal Ultrasounds or other Referrals:  None Maternal Substance Abuse:  No Significant Maternal Medications:  None Significant Maternal Lab Results: Group B Strep negative and Rh negative Number of Prenatal Visits:greater than 3 verified prenatal visits Maternal Vaccinations:RSV: Given during pregnancy >/=14 days ago and TDap Other Comments:  None   Results for orders placed or performed during the hospital encounter of 02/19/24 (from the past 24 hours)  POCT fern test   Collection Time: 02/19/24  5:21 AM  Result Value Ref Range   POCT Fern Test Positive = ruptured amniotic membanes   Type and screen [facility]   Collection Time: 02/19/24  5:35 AM  Result Value Ref Range   ABO/RH(D) O NEG    Antibody Screen POS    Sample Expiration 02/22/2024,2359    Antibody Identification      PASSIVELY ACQUIRED ANTI-D Performed at [facility], [address], [place], [place] [address]   CBC   Collection Time: 02/19/24  5:46 AM  Result Value Ref Range   WBC 6.6 4.0 - 10.5 K/uL   RBC 4.55 3.87 - 5.11 MIL/uL   Hemoglobin 13.2 12.0 - 15.0 g/dL   HCT 60.9 63.9  - 53.9 %   MCV 85.7 80.0 - 100.0 fL   MCH 29.0 26.0 - 34.0 pg   MCHC 33.8 30.0 - 36.0 g/dL   RDW 85.4 88.4 - 84.4 %   Platelets 205 150 - 400 K/uL   nRBC 0.0 0.0 - 0.2 %  Comprehensive metabolic panel   Collection Time: 02/19/24  5:46 AM  Result Value Ref Range   Sodium 133 (L) 135 - 145 mmol/L   Potassium 3.7 3.5 - 5.1 mmol/L   Chloride 103 98 - 111 mmol/L   CO2 18 (L) 22 - 32 mmol/L   Glucose, Bld 80 70 - 99 mg/dL   BUN <5 (L) 6 - 20 mg/dL   Creatinine, Ser 9.41 0.44 - 1.00 mg/dL   Calcium  9.3 8.9 - 10.3 mg/dL   Total Protein 7.1 6.5 - 8.1 g/dL   Albumin 3.7 3.5 - 5.0 g/dL   AST 27 15 - 41 U/L   ALT 15 0 - 44 U/L   Alkaline Phosphatase 225 (H) 38 - 126 U/L   Total Bilirubin 0.3 0.0 - 1.2 mg/dL   GFR, Estimated >39 >39 mL/min   Anion gap 13 5 - 15    Patient Active Problem List   Diagnosis Date Noted   Indication for care or intervention related to labor and delivery 02/19/2024   Supervision of normal pregnancy 08/01/2023   Rh negative state in antepartum period 03/06/2021    Assessment/Plan:  Madalyne Anti Meri Mccartin is a 34 y.o. G2P1001 at [redacted]w[redacted]d here for spontaneous labor.   #Labor:Spontaneous labor - Proceed with delivery   #Pain: No epidural, supportive techniques,  breathing techniques #FWB: Category I tracing #GBS status:  negative #Feeding: Breastmilk  #Reproductive Life planning: None #Circ:  yes  #Rh- - Rh workup if appropriate  Garen SHAUNNA Puffer, MD  02/19/2024, 7:04 AM   Attestation of Supervision of Student:  I confirm that I have verified the information documented in the resident's note and that I have also personally reperformed the history, physical exam and all medical decision making activities.  I have verified that all services and findings are accurately documented in this student's note; and I agree with management and plan as outlined in the documentation. I have also made any necessary editorial changes.  Barkley LITTIE Angles, MD OB  Fellow 02/19/2024 7:48 AM       [1] No Known Allergies

## 2024-02-19 NOTE — Plan of Care (Signed)
  Problem: Education: Goal: Knowledge of Childbirth will improve Outcome: Progressing Goal: Ability to make informed decisions regarding treatment and plan of care will improve Outcome: Progressing Goal: Ability to state and carry out methods to decrease the pain will improve Outcome: Progressing Goal: Individualized Educational Video(s) Outcome: Progressing   Problem: Coping: Goal: Ability to verbalize concerns and feelings about labor and delivery will improve Outcome: Progressing   Problem: Life Cycle: Goal: Ability to make normal progression through stages of labor will improve Outcome: Progressing Goal: Ability to effectively push during vaginal delivery will improve Outcome: Progressing   Problem: Role Relationship: Goal: Will demonstrate positive interactions with the child Outcome: Progressing   Problem: Safety: Goal: Risk of complications during labor and delivery will decrease Outcome: Progressing   Problem: Pain Management: Goal: Relief or control of pain from uterine contractions will improve Outcome: Progressing   Problem: Education: Goal: Knowledge of General Education information will improve Description: Including pain rating scale, medication(s)/side effects and non-pharmacologic comfort measures Outcome: Progressing   Problem: Health Behavior/Discharge Planning: Goal: Ability to manage health-related needs will improve Outcome: Progressing   Problem: Clinical Measurements: Goal: Ability to maintain clinical measurements within normal limits will improve Outcome: Progressing Goal: Will remain free from infection Outcome: Progressing Goal: Diagnostic test results will improve Outcome: Progressing Goal: Respiratory complications will improve Outcome: Progressing Goal: Cardiovascular complication will be avoided Outcome: Progressing   Problem: Activity: Goal: Risk for activity intolerance will decrease Outcome: Progressing   Problem:  Nutrition: Goal: Adequate nutrition will be maintained Outcome: Progressing   Problem: Coping: Goal: Level of anxiety will decrease Outcome: Progressing   Problem: Elimination: Goal: Will not experience complications related to bowel motility Outcome: Progressing Goal: Will not experience complications related to urinary retention Outcome: Progressing   Problem: Pain Managment: Goal: General experience of comfort will improve and/or be controlled Outcome: Progressing   Problem: Safety: Goal: Ability to remain free from injury will improve Outcome: Progressing   Problem: Skin Integrity: Goal: Risk for impaired skin integrity will decrease Outcome: Progressing

## 2024-02-20 MED ORDER — RHO D IMMUNE GLOBULIN 1500 UNIT/2ML IJ SOSY
300.0000 ug | PREFILLED_SYRINGE | Freq: Once | INTRAMUSCULAR | Status: AC
  Administered 2024-02-20: 300 ug via INTRAVENOUS
  Filled 2024-02-20: qty 2

## 2024-02-20 NOTE — Progress Notes (Signed)
 POSTPARTUM PROGRESS NOTE  Post Partum Day 1  Subjective:  Pamela Santos is a 34 y.o. H7E7997 s/p SVD at [redacted]w[redacted]d.  No acute events overnight.  Pt denies problems with ambulating, voiding or po intake.  She denies nausea or vomiting.  Pain is well controlled.  She has not had flatus. She has had bowel movement.  Lochia Small. Does endorse some pain and feeling of rawness when breastfeeding.   Objective: Blood pressure 102/71, pulse 69, temperature 98.3 F (36.8 C), temperature source Oral, resp. rate 18, height 5' 3 (1.6 m), weight 73.5 kg, last menstrual period 05/23/2023, SpO2 99%, unknown if currently breastfeeding.  Physical Exam:  General: alert, cooperative and no distress Chest: no respiratory distress Heart:regular rate, distal pulses intact Abdomen: soft, nontender,  Uterine Fundus: firm, appropriately tender DVT Evaluation: No calf swelling or tenderness Extremities: no edema Skin: warm, dry  Recent Labs    02/19/24 0546  HGB 13.2  HCT 39.0    Assessment/Plan: Pamela Santos is a 34 y.o. H7E7997 s/p SVD at [redacted]w[redacted]d   PPD#1 - Doing well Contraception: none Feeding: breast Dispo: Plan for discharge tomorrow. Nipple pain: lactation consult   LOS: 1 day   Garen SHAUNNA Puffer, MD, PGY-1 02/20/2024, 6:21 AM   Attestation of Supervision of Resident: Evaluation and management procedures were performed by the learners: Family Medicine Resident under my supervision. I was immediately available for direct supervision, assistance and direction throughout this encounter.  I also confirm that I have verified the information documented in the residents note, and that I have also personally reperformed the pertinent components of the physical exam and all of the medical decision making activities.  I have also made any necessary editorial changes.  Charlie DELENA Courts, MD FM-OB Fellow [facility]

## 2024-02-20 NOTE — Patient Instructions (Signed)

## 2024-02-20 NOTE — Lactation Note (Signed)
 This note was copied from a baby's chart. Lactation Consultation Note  Patient Name: Pamela Santos Date: 02/20/2024 Age:32 hours Reason for consult: Follow-up assessment;Nipple pain/trauma;Breastfeeding assistance  Mother declined interpretation.  P2, Baby cueing when LC entered the room. Assisted mother with achieving a deep latch, flanging lips and guiding baby deep on the breast. After a few sucks, mother stated it felt comfortable. Reviewed engorgement care.  Feeding Mother's Current Feeding Choice: Breast Milk  LATCH Score Latch: Grasps breast easily, tongue down, lips flanged, rhythmical sucking.  Audible Swallowing: A few with stimulation  Type of Nipple: Everted at rest and after stimulation  Comfort (Breast/Nipple): Filling, red/small blisters or bruises, mild/mod discomfort  Hold (Positioning): Assistance needed to correctly position infant at breast and maintain latch.  LATCH Score: 7   Interventions Interventions: Breast feeding basics reviewed;Assisted with latch;Skin to skin;Education  Discharge Discharge Education: Engorgement and breast care Pump: Hands Free;Personal  Consult Status Consult Status: Follow-up Date: 02/21/24 Follow-up type: In-patient   Shannon Levorn Lemme  RN, IBCLC 02/20/2024, 1:34 PM

## 2024-02-21 ENCOUNTER — Encounter: Admitting: Family Medicine

## 2024-02-21 LAB — RH IG WORKUP (INCLUDES ABO/RH)
Fetal Screen: NEGATIVE
Gestational Age(Wks): 39
Unit division: 0

## 2024-02-21 MED ORDER — IBUPROFEN 800 MG PO TABS: 800.0000 mg | ORAL_TABLET | Freq: Three times a day (TID) | ORAL | Status: AC

## 2024-02-21 MED ORDER — ACETAMINOPHEN 325 MG PO TABS: 650.0000 mg | ORAL_TABLET | ORAL | Status: AC | PRN

## 2024-02-21 NOTE — Lactation Note (Signed)
 This note was copied from a baby's chart. Lactation Consultation Note  Patient Name: Pamela Santos Unijb'd Date: 02/21/2024 Age:32 hours Reason for consult: Follow-up assessment;MD order;Term  P2- MD requested for LC to consult with MOB because she is having toe curling pain when latching. MOB had this reaction to latching her infant from day 1 when this [facility] consulted with her. MOB also reports having this reaction to breastfeeding her older child. Per MOB this usually lasts for 1 week, then it goes away. MOB has hyper sensitive breasts. During the consult, MOB had infant latched to her left breast in the cradle hold. Infant was belly to belly and had flanged lips. LC reviewed the latching techniques that this [facility] taught her on day one to ensure a deep latch. MOB verbalized understanding. Despite the initial pain of latching, MOB's breasts and nipples look great, no damage noted at this time. LC reviewed engorgement/nipple care with MOB. MOB denies having any questions or concerns at this time. LC encouraged MOB to call for further assistance as needed.  Maternal Data Has patient been taught Hand Expression?: Yes Does the patient have breastfeeding experience prior to this delivery?: Yes  Feeding Mother's Current Feeding Choice: Breast Milk  LATCH Score Latch: Grasps breast easily, tongue down, lips flanged, rhythmical sucking.  Audible Swallowing: Spontaneous and intermittent  Type of Nipple: Everted at rest and after stimulation  Comfort (Breast/Nipple): Engorged, cracked, bleeding, large blisters, severe discomfort  Hold (Positioning): Assistance needed to correctly position infant at breast and maintain latch.  LATCH Score: 7   Lactation Tools Discussed/Used Pump Education: Milk Storage  Interventions Interventions: Breast feeding basics reviewed;Adjust position;Education;LC Services brochure  Discharge Discharge Education: Engorgement and breast care;Warning signs for  feeding baby Pump: Hands Free;Personal  Consult Status Consult Status: Complete Date: 02/21/24    Recardo Hoit BS, IBCLC 02/21/2024, 10:37 AM

## 2024-02-27 ENCOUNTER — Encounter: Admitting: Certified Nurse Midwife

## 2024-02-29 ENCOUNTER — Telehealth (HOSPITAL_COMMUNITY): Payer: Self-pay | Admitting: *Deleted

## 2024-02-29 NOTE — Telephone Encounter (Signed)
 02/29/2024  Name: Pamela Santos MRN: [id number] DOB: [birth year]  Reason for Call:  Transition of Care Hospital Discharge Call  Contact Status: Patient Contact Status: Complete  Language assistant needed: Interpreter Mode: Telephonic Interpreter Interpreter Name: Toma #[id number] Interpreter Phone Number - If applicable: Language Line [phone]        Follow-Up Questions: Do You Have Any Concerns About Your Health As You Heal From Delivery?: No Do You Have Any Concerns About Your Infants Health?: No  Edinburgh Postnatal Depression Scale:  In the Past 7 Days:   Patient reported that her answers are the same as when she completed the EPDS in the hospital on 02/19/24. Score at that time was 1. Reported that she is doing well and has support. EPDS not completed at this time. PHQ2-9 Depression Scale:     Discharge Follow-up: Edinburgh score requires follow up?: N/A  Post-discharge interventions: Reviewed Newborn Safe Sleep Practices  Signature Allean IVAR Carton, RN, 02/29/24, [phone]

## 2024-04-01 ENCOUNTER — Ambulatory Visit: Payer: Self-pay | Admitting: Family Medicine
